# Patient Record
Sex: Female | Born: 1965 | Race: White | Hispanic: No | Marital: Married | Smoking: Current every day smoker
Health system: Southern US, Community
[De-identification: ages and names within clinical notes are randomized; demographics above are authoritative.]

## PROBLEM LIST (undated history)

## (undated) DIAGNOSIS — Z972 Presence of dental prosthetic device (complete) (partial): Secondary | ICD-10-CM

## (undated) DIAGNOSIS — L0293 Carbuncle, unspecified: Secondary | ICD-10-CM

## (undated) DIAGNOSIS — K219 Gastro-esophageal reflux disease without esophagitis: Secondary | ICD-10-CM

## (undated) DIAGNOSIS — E079 Disorder of thyroid, unspecified: Secondary | ICD-10-CM

## (undated) DIAGNOSIS — T7840XA Allergy, unspecified, initial encounter: Secondary | ICD-10-CM

## (undated) DIAGNOSIS — L0292 Furuncle, unspecified: Secondary | ICD-10-CM

## (undated) HISTORY — DX: Gastro-esophageal reflux disease without esophagitis: K21.9

## (undated) HISTORY — PX: TUBAL LIGATION: SHX77

## (undated) HISTORY — PX: DILATION AND CURETTAGE OF UTERUS: SHX78

## (undated) HISTORY — DX: Carbuncle, unspecified: L02.93

## (undated) HISTORY — DX: Disorder of thyroid, unspecified: E07.9

## (undated) HISTORY — DX: Furuncle, unspecified: L02.92

## (undated) HISTORY — DX: Allergy, unspecified, initial encounter: T78.40XA

## (undated) HISTORY — PX: OTHER SURGICAL HISTORY: SHX169

## (undated) HISTORY — DX: Presence of dental prosthetic device (complete) (partial): Z97.2

---

## 2000-11-23 ENCOUNTER — Other Ambulatory Visit: Admission: RE | Admit: 2000-11-23 | Discharge: 2000-11-23 | Payer: Self-pay | Admitting: Family Medicine

## 2002-07-08 ENCOUNTER — Encounter: Payer: Self-pay | Admitting: *Deleted

## 2002-07-08 ENCOUNTER — Ambulatory Visit (HOSPITAL_COMMUNITY): Admission: RE | Admit: 2002-07-08 | Discharge: 2002-07-08 | Payer: Self-pay | Admitting: *Deleted

## 2002-08-22 ENCOUNTER — Ambulatory Visit (HOSPITAL_COMMUNITY): Admission: RE | Admit: 2002-08-22 | Discharge: 2002-08-22 | Payer: Self-pay | Admitting: *Deleted

## 2002-08-22 ENCOUNTER — Encounter: Payer: Self-pay | Admitting: *Deleted

## 2002-11-05 ENCOUNTER — Ambulatory Visit (HOSPITAL_COMMUNITY): Admission: RE | Admit: 2002-11-05 | Discharge: 2002-11-05 | Payer: Self-pay | Admitting: *Deleted

## 2002-11-14 ENCOUNTER — Ambulatory Visit (HOSPITAL_COMMUNITY): Admission: AD | Admit: 2002-11-14 | Discharge: 2002-11-14 | Payer: Self-pay | Admitting: *Deleted

## 2002-11-16 ENCOUNTER — Inpatient Hospital Stay (HOSPITAL_COMMUNITY): Admission: AD | Admit: 2002-11-16 | Discharge: 2002-11-19 | Payer: Self-pay | Admitting: *Deleted

## 2002-12-13 ENCOUNTER — Ambulatory Visit (HOSPITAL_COMMUNITY): Admission: RE | Admit: 2002-12-13 | Discharge: 2002-12-13 | Payer: Self-pay | Admitting: *Deleted

## 2003-01-17 ENCOUNTER — Ambulatory Visit (HOSPITAL_COMMUNITY): Admission: RE | Admit: 2003-01-17 | Discharge: 2003-01-17 | Payer: Self-pay | Admitting: *Deleted

## 2003-01-17 ENCOUNTER — Encounter: Payer: Self-pay | Admitting: *Deleted

## 2003-01-20 ENCOUNTER — Ambulatory Visit (HOSPITAL_COMMUNITY): Admission: RE | Admit: 2003-01-20 | Discharge: 2003-01-20 | Payer: Self-pay | Admitting: *Deleted

## 2005-09-10 ENCOUNTER — Encounter (HOSPITAL_COMMUNITY): Admission: RE | Admit: 2005-09-10 | Discharge: 2005-10-10 | Payer: Self-pay | Admitting: Internal Medicine

## 2006-11-09 ENCOUNTER — Ambulatory Visit (HOSPITAL_COMMUNITY): Admission: RE | Admit: 2006-11-09 | Discharge: 2006-11-09 | Payer: Self-pay | Admitting: Internal Medicine

## 2006-11-25 ENCOUNTER — Ambulatory Visit (HOSPITAL_COMMUNITY): Admission: RE | Admit: 2006-11-25 | Discharge: 2006-11-25 | Payer: Self-pay | Admitting: Internal Medicine

## 2008-01-17 ENCOUNTER — Encounter (INDEPENDENT_AMBULATORY_CARE_PROVIDER_SITE_OTHER): Payer: Self-pay | Admitting: General Surgery

## 2008-01-17 ENCOUNTER — Ambulatory Visit (HOSPITAL_COMMUNITY): Admission: RE | Admit: 2008-01-17 | Discharge: 2008-01-17 | Payer: Self-pay | Admitting: General Surgery

## 2010-11-19 NOTE — Op Note (Signed)
Kelly Henson, Kelly Henson            ACCOUNT NO.:  [id number]   MEDICAL RECORD NO.:  [id number]          PATIENT TYPE:  AMB   LOCATION:  DAY                           FACILITY:  [facility]   PHYSICIAN:  Dalia Heading, M.D.  DATE OF BIRTH:  [birth year]   DATE OF PROCEDURE:  01/17/2008  DATE OF DISCHARGE:                               OPERATIVE REPORT   PREOPERATIVE DIAGNOSIS:  Cholecystitis, cholelithiasis.   POSTOPERATIVE DIAGNOSIS:  Cholecystitis, cholelithiasis.   PROCEDURE:  Laparoscopic cholecystectomy.   SURGEON:  Dalia Heading, MD   ANESTHESIA:  General endotracheal.   INDICATIONS:  The patient is a 45 year old white female who presents  with cholecystitis secondary to cholelithiasis.  The risks and benefits  of the procedure including bleeding, infection, hepatobiliary injury,  the possibility of an open procedure were fully explained to the  patient, gave informed consent.   PROCEDURE NOTE:  The patient was placed in the supine position.  After  induction of general endotracheal anesthesia, the abdomen was prepped  and draped using the usual sterile technique with Betadine.  Surgical  site confirmation was performed.   A supraumbilical incision was made down to the fascia.  A Veress needle  was introduced into the abdominal cavity and confirmation of placement  was done using the saline drop test.  The abdomen was then insufflated  to 16 mmHg pressure.  An 11-mm trocar was introduced into the abdominal  cavity under direct visualization without difficulty.  The patient was  placed in reversed Trendelenburg position.  Additional 11-mm trocars  were placed in the epigastric region and 5-mm trocars were placed in the  right upper quadrant and right flank regions.  The liver was inspected  and noted to be within normal limits.  The gallbladder was retracted  superior and laterally.  The dissection was begun around the  infundibulum of the gallbladder.  The cystic duct was  first identified.  Its juncture to the infundibulum fully identified.  Endoclips placed  proximally and distally on the cystic duct and the cystic duct was  divided.  This was likewise done in the cystic artery.  The gallbladder  was then freed away from the gallbladder fossa using Bovie  electrocautery.  The gallbladder was delivered through the epigastric  trocar site using EndoCatch bag.  The gallbladder fossa was inspected.  No abnormal bleeding or bile leakage was noted.  Surgicel was placed in  the gallbladder fossa.  All fluid and air were then evacuated from the  abdominal cavity prior to removal of the trocars.   All wounds were irrigated with normal saline.  All wounds were injected  with 0.5% Sensorcaine.  The supraumbilical fascia was reapproximated  using an 0 Vicryl interrupted suture.  All skin incisions were closed  using staples.  Betadine ointment and dry sterile dressings were  applied.   All tape and needle counts were correct at the end of the procedure.  The patient was extubated in the operating room and went back to  recovery room awake in stable condition.   COMPLICATIONS:  None.   SPECIMEN:  Gallbladder.  ESTIMATED BLOOD LOSS:  Minimal.      Dalia Heading, M.D.  Electronically Signed     MAJ/MEDQ  D:  01/17/2008  T:  01/17/2008  Job:  [id number]   cc:   Tesfaye D. Felecia Shelling, MD  Fax: [phone]

## 2010-11-19 NOTE — H&P (Signed)
NAME:  Kelly Henson, Kelly Henson            ACCOUNT NO.:  [id number]   MEDICAL RECORD NO.:  [id number]          PATIENT TYPE:  AMB   LOCATION:  DAY                           FACILITY:  [facility]   PHYSICIAN:  Dalia Heading, M.D.  DATE OF BIRTH:  10-10-1965   DATE OF ADMISSION:  DATE OF DISCHARGE:  LH                              HISTORY & PHYSICAL   CHIEF COMPLAINT:  Biliary colic secondary to cholelithiasis.   HISTORY OF PRESENT ILLNESS:  The patient is a 45 year old white female  who presents with a multiple-month history of recurrent epigastric and  right upper quadrant abdominal pain, nausea, and indigestion.  The  episodes have recently increased in frequency and she presented to the  emergency room for further evaluation treatment.  She was found on  ultrasound of the gallbladder to have cholelithiasis.  Her common bile  duct was within normal limits.  She states the pain radiates to her  right flank and to her right shoulder.  It is made worse with fatty  foods.  No fever, chills, or jaundice have been noted.   PAST MEDICAL HISTORY:  Includes hypothyroidism.   PAST SURGICAL HISTORY:  D&C.   CURRENT MEDICATIONS:  Synthroid.   ALLERGIES:  No known drug allergies.   REVIEW OF SYSTEMS:  Noncontributory.   PHYSICAL EXAMINATION:  The patient is a well-developed, well-nourished  white female in no acute distress.  She is afebrile.  VITAL SIGNS:  Stable.  LUNGS:  Clear to auscultation with equal breath sounds bilaterally.  HEART:  Regular rate and rhythm without S3, S4, or murmurs.  ABDOMEN:  Soft with tenderness down the right upper quadrant to  palpation.  No hepatosplenomegaly, masses, or hernias identified.   CBC, MET-7, and liver profile were all within normal limits.  Amylase  and lipase were within normal limits.   IMPRESSION:  Biliary colic secondary to cholelithiasis.   PLAN:  The patient is scheduled for a laparoscopic cholecystectomy on  January 17, 2008.  The risks and  benefits of the procedure including  bleeding, infection, hepatobiliary, and the possibility of an open  procedure were fully explained to the patient, gave informed consent.      Dalia Heading, M.D.  Electronically Signed     MAJ/MEDQ  D:  01/14/2008  T:  01/15/2008  Job:  [id number]   cc:   Tesfaye D. Felecia Shelling, MD  Fax: [phone]

## 2010-11-22 NOTE — H&P (Signed)
NAME:  Kelly Henson, Kelly Henson                        ACCOUNT NO.:  [id number]   MEDICAL RECORD NO.:  [id number]                  PATIENT TYPE:   LOCATION:                                       FACILITY:   PHYSICIAN:  Langley Gauss, M.D.                DATE OF BIRTH:   DATE OF ADMISSION:  12/13/2002  DATE OF DISCHARGE:                                HISTORY & PHYSICAL   This is a 45 year old, gravida 4, para 4, four prior vaginal deliveries who  was admitted for laparoscopic tubal ligation, lysing, bipolar cautery as  well as dilatation and curettage utilizing curettage only.  The patient  desires permanent sterilization at this time.  She understands that there  are other reversible forms of birth control available; however, she desires  to proceed with permanent and irreversible sterilization.  In addition, will  be performing a D&C as patient is noted to have a history of a retained  placenta.  Following vaginal delivery on 11/16/2002, the patient also  experienced postpartum hemorrhage due to this retained placenta with a  resultant anemia.  The patient has done well since delivery with only a mild  amount of irregular bleeding.  She has passed several small clots during  this past week; however, there is significant concern that there may  possibly be some adherent placenta remaining, and the patient would benefit  from curettage.  The patient notably has been on iron replacement therapy  with improvement in hemoglobin to 12.4.  She did not receive any blood  products during that most recent hospitalization.   SOCIAL HISTORY:  She smokes one to one and one-half packs per day.  She is  employed as a Lawyer for shipments.   PAST MEDICAL HISTORY:  1. Vaginal delivery x 4.  2. Pilonidal cyst removed about 10 years previously.   ALLERGIES:  No known drug allergies.   CURRENT MEDICATIONS:  Zyrtec 10 mg p.o. daily p.r.n.   PHYSICAL EXAMINATION:  GENERAL:  No acute distress.  Pleasant  female.  VITAL SIGNS:  Height 5 feet 6 inches, weight 192.  Blood pressure 129/80,  pulse 91, respiratory rate 20.  HEENT:  Negative.  NECK:  No adenopathy.  Neck is supple.  Thyroid not palpable.  LUNGS:  Clear.  CARDIOVASCULAR:  Regular rate and rhythm.  ABDOMEN:  Soft and nontender.  No surgical scars are identified.  No pelvic  or abdominal masses appreciated.  EXTREMITIES:  Normal.  PELVIC:  Normal external genitalia.  No lesions or ulcerations identified.  Very light vaginal bleeding on today's visit.  Cervix is noted to be closed.  Uterus is noted to be diffusely enlarged consistent with about 10 to 12-week  size.  It is, however, nontender.  No palpable adnexal masses.   LABORATORY DATA:  As stated previously, hemoglobin today 12.4.   ASSESSMENT:  The patient desires permanent sterilization at this time.  Risks of the operative  procedure discussed with the patient to include risk  of bowel or bladder or vascular injury as well as risk associated with the  general anesthesia.  The patient, in addition, does have possibility of some  retained placenta fragments which could ultimately lead to irregular menses;  thus, would also proceed with dilatation and curettage of uterine contents  during that same operative procedure on 12/13/2002.                                               Langley Gauss, M.D.    DC/MEDQ  D:  12/07/2002  T:  12/07/2002  Job:  [id number]

## 2010-11-22 NOTE — H&P (Signed)
   NAME:  Kelly Henson, Kelly Henson                        ACCOUNT NO.:  [id number]   MEDICAL RECORD NO.:  [id number]                  PATIENT TYPE:   LOCATION:                                       FACILITY:   PHYSICIAN:  Langley Gauss, M.D.                DATE OF BIRTH:   DATE OF ADMISSION:  12/13/2002  DATE OF DISCHARGE:                                HISTORY & PHYSICAL   ADDENDUM:  [name] is to be admitted through outpatient surgery for [facility]  and laparoscopic tubal ligation by using bipolar cautery.  The patient is  postpartum a vaginal delivery on Nov 17, 2002.  The delivery was complicated  by retained placenta requiring a curettage in labor and delivery.  Thus,  there is a concern for retained placental tissue which may be responsible  for her quantitative beta hCG of 140 obtained on December 09, 2002.  My  expectation is that this does represent some retained viable placental  tissue.  Thus it is recommended to repeat the quantitative beta hCG  preoperatively - either December 12, 2002 or December 13, 2002 - and if this does in  fact show a declining value I feel we can safely proceed with the planned  surgical procedure with the impression of retained placental tissue which  can be managed with the dilatation and curettage.                                                Langley Gauss, M.D.    DC/MEDQ  D:  12/12/2002  T:  12/12/2002  Job:  [id number]   cc:   [facility]

## 2010-11-22 NOTE — Op Note (Signed)
NAME:  Kelly Henson, Kelly Henson                      ACCOUNT NO.:  [id number]   MEDICAL RECORD NO.:  [id number]                   PATIENT TYPE:  AMB   LOCATION:  DAY                                  FACILITY:  [facility]   PHYSICIAN:  Langley Gauss, M.D.                DATE OF BIRTH:  1965-08-30   DATE OF PROCEDURE:  12/13/2002  DATE OF DISCHARGE:                                 OPERATIVE REPORT   PREOPERATIVE DIAGNOSES:  1. Desires permanent sterilization.  2. Postpartum bleeding secondary to retained placental tissue.   POSTOPERATIVE DIAGNOSES:  1. Desires permanent sterilization.  2. Postpartum bleeding secondary to retained placental tissue.   PROCEDURE:  1. Dilatation and curettage.  2. Laparoscopic tubal ligation utilizing bipolar cautery.   SURGEON:  Roylene Reason. Lisette Grinder, M.D.   ESTIMATED BLOOD LOSS:  Less than 100 cc   COMPLICATIONS:  None.   ANALGESIA:  General endotracheal.   SPECIMENS:  Uterine curettings for permanent section only.   FINDINGS AT TIME OF SURGERY:  Include mildly enlarged uterus sounding for a  depth of about 10 cm.  A moderate amount of placental tissue at the uterine  fundus.  The uterus was noted to be well supported. Following procedure a  normal regular intrauterine contour is achieved.   SUMMARY:  The patient was taken to the operating room and vital signs were  stable.  The patient underwent uncomplicated induction of general  endotracheal anesthesia.  She was then placed in the lithotomy position and  prepped and draped in the usual sterile manner.  A red rubber catheter was  used to drain about 50 cc clear yellow urine from the bladder.  The speculum  was then placed in the vaginal vault.  The anterior lip of the cervix was  grasped with a single-tooth tenaculum.  The uterus was then noted to be  sounded to a depth of 10 cm.  Progressive dilatation was then performed  utilizing Hager dilators up to a size #17 dilator.  This allowed passage of  a  curette up to the cervix into the uterine cavity and aggressive curettage  is performed of the uterine cavity until a fine uterine cry was appreciated  in all quadrants of the uterus.   The apparent retained placental tissue was identified within the anterior  portion of the uterine fundus.  Anterior curettage was continued but no  additional tissue was obtained.  In addition the Randall stone forceps were  used to explore the uterine cavity as well as sponge stick with additional  fragments of placental type tissue removed from the uterine wall.  After  completion of the curettage it was determined that the retained placental  tissue had been removed.   The curettage was then discontinued.  A Hulka tenaculum was used to grasp  the cervix at 12 o'clock.  I then changed to sterile gloves and standing at  the patient's left side, a  1 cm vertical incision was made just inferior to  the umbilicus; as well as suprapubic horizontal incision.  The abdominal  wall was then elevated.  The Veress needle was then placed through the  subumbilical incision and going perpendicular to the fascial plane.  A drop  test confirms the proper intraperitoneal placement with no apparent bowel or  vascular injury.  Pneumoperitoneum is then created by insufflating 4 liters  of CO2 gas at a filling pressure of less than 15 mmHg.  After assurance of  adequate pneumoperitoneum the Veress needle is removed and the abdominal  wall was again elevated.  The Vis-A-Port 10 mm trocar was freed, inserted  subumbilically angling towards the hollow of the sacrum.  This was done  atraumatically and confirms a proper atraumatic intraperitoneal placement.  Under direct visualization the 5 mm trocar is inserted suprapubically in the  midline.  This was likewise done atraumatically with the patient now in  Trendelenburg position and the uterus elevated I was able to visualize the  pelvic cavity.  The tubes and ovaries were noted  to be normal in appearance  with no adhesive disease.  The uterus was noted, likewise with normal size,  shape and consistency, with a normal exterior contour. No irregularities  identified.  No adhesive disease, no free fluid or purulence identified.  Normal erythema noted.   The triple cauterization is then performed bilaterally utilizing the  Kleppinger bipolar cauterization forceps with the most proximal being 1 cm  from the tubouterine junction.  Each of these bursts were then in direct  continuity with the previous with the most proximal, as stated previously,  being 1 cm from tubouterine junction.  Each of these were in direct  continuity of each other and at each time continuing until the cauterizing  current plateaued down at 1-2 LED lights. This resulted in extensive tubular  destruction bilaterally.  Both right and left fallopian tubes were  cauterized.   The procedure was then terminated, our instruments are removed as well as  the gas allowed to escape and the 3 incision sites were closed utilizing 2-0  Vicryl in deep interrupted fashion through-and-through the fascia.  This  likewise results in reapproximation of the skin edges.  No additional  suturing is required.  Band-aids are placed over the operative site.  The  procedure is terminated.  The Hulka tenaculum is removed from the uterine  cavity.  No active bleeding is noted to occur.  The patient is then reversed  of anesthesia and taken to the recovery room in stable condition.  I was  unable to locate any waiting family members.                                               Langley Gauss, M.D.    DC/MEDQ  D:  12/13/2002  T:  12/13/2002  Job:  [id number]

## 2010-11-22 NOTE — Discharge Summary (Signed)
   NAME:  Kelly Henson, Kelly Henson                        ACCOUNT NO.:  [id number]   MEDICAL RECORD NO.:  [id number]                   PATIENT TYPE:  OIB   LOCATION:  LDR3                                 FACILITY:  [facility]   PHYSICIAN:  Langley Gauss, M.D.                DATE OF BIRTH:  May 31, 1966   DATE OF ADMISSION:  11/05/2002  DATE OF DISCHARGE:  11/05/2002                                 DISCHARGE SUMMARY   OBSTETRIC OBSERVATION NOTE:  A 45 year old gravida 4, para 3, 36-2/[redacted] weeks  gestation who present to labor and deliver complaining of an episode of  vaginal bleeding this a.m.  Patient states when she got up this a.m. and  went to void she noticed a small area of blood in her undergarments.  She  denies any active bleeding at that time nor has she had any subsequent  bleeding.  The patient describes good fetal movement today, an occasional  tightening, increased pelvic pressure.  Patient's prenatal course otherwise  complicated only by her failure to keep her arrangements for a 3-hour  glucose tolerance test.  She is noted to have a 1-hour GTT on multiple  previous occasions.  She has been advised and a 3-hour GTT has been ordered  and the patient has agreed to have it performed.   PAST MEDICAL HISTORY:  Pertinent for three prior vaginal deliveries without  complications.   PHYSICAL EXAMINATION:  GENERAL:  Patient is in no acute distress.  ABDOMEN:  Gravid.  Uterus soft and nontender consistent with [redacted] weeks  gestation.  PELVIC:  Normal  external genitalia.  Cervix noted to be without lesion.  No  bleeding is identified.  No leakage of fluid.  Cervix on digital examination  1 cm dilated, very posterior with a -3 station, vertex presentation.  External fetal monitor - no uterine contractions are seen.  No uterine  irritability.  Fetal heart rate is 150 with normal  long term variability.   ASSESSMENT:  Multiparous patient at 36-weeks gestation, isolated limited  episode x1 of vaginal  bleeding and abdominal pain, false labor.   DISPOSITION:  Patient is to keep next scheduled office appointment.  Signs  or symptoms of labor, ruptured membranes reviewed with the patient prior to  discharge.                                                Langley Gauss, M.D.    DC/MEDQ  D:  11/05/2002  T:  11/05/2002  Job:  [id number]

## 2010-11-22 NOTE — Op Note (Signed)
   NAME:  KIAJA, SHORTY                        ACCOUNT NO.:  [id number]   MEDICAL RECORD NO.:  [id number]                   PATIENT TYPE:  INP   LOCATION:  A426                                 FACILITY:  [facility]   PHYSICIAN:  Langley Gauss, M.D.                DATE OF BIRTH:  07/16/1965   DATE OF PROCEDURE:  11/16/2002  DATE OF DISCHARGE:                                 OPERATIVE REPORT   PROGRESS NOTE:  The patient had a Foley bulb catheter placed intracervically  and inflated to a volume of 60 mL of sterile normal saline.  About two hours  after placement, it fell out spontaneously as it had been taped to the  patient's leg.  Thereafter she was managed expectantly.  She continued to  have reassuring fetal heart rate.  Pitocin induction was initiated at 0400.  When I examined the patient at 0800, the patient was noted to be 4 cm  dilated and at -1 station.  I attempted to perform an amniotomy.  However,  this was unsuccessful.  The patient was on 8 milliunits of Pitocin per  minute at that time.  She requested epidural analgesic.  Epidural was placed  without difficulty and gave an excellent bilateral block which allowed me to  examine the patient at which time she was found to be 4 cm dilated, 60%  effaced, -1 station, vertex presentation.  Amniotomy was performed on this  attempt with findings of clear amniotic fluid.  A fetal scalp electrode was  placed.  This documented a reassuring fetal heart rate.  Subsequently, the  heart rate seemed to be somewhat irregular.  Fetal scalp electrode was  replaced, but even with replacement, the heart rate was somewhat irregular  and may represent some PVCs in the baby.  Pitocin is continuing at this  point in time.                                               Langley Gauss, M.D.    DC/MEDQ  D:  11/17/2002  T:  11/18/2002  Job:  [id number]

## 2010-11-22 NOTE — Op Note (Signed)
NAMERAVENNE, WAYMENT                          ACCOUNT NO.:  [id number]   MEDICAL RECORD NO.:  [id number]                  PATIENT TYPE:  PINP   LOCATION:                                       FACILITY:  [facility]   PHYSICIAN:  Langley Gauss, M.D.                DATE OF BIRTH:  Nov 29, 1965   DATE OF PROCEDURE:  11/17/2002  DATE OF DISCHARGE:                                 OPERATIVE REPORT   DIAGNOSES:  1. A 37-6/7-week intrauterine pregnancy.  2. Gestational hypertension.   PROCEDURES:  1. Placement of continuous lumbar epidural analgesia by Langley Gauss, M.D.  2. Spontaneous assisted vaginal delivery of a 5 pound 13 ounce female infant     delivered over an intact perineum.  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   ESTIMATED BLOOD LOSS:  800 mL.   DELIVERING PHYSICIAN:  Delivery performed by Langley Gauss, M.D.   ANALGESIA:  Continuous lumbar epidural.  At the time of attempted manual  removal of the placenta, the patient was in addition treated with a bolus of  10 mL of 1% lidocaine and subsequently 5 mL of 2% lidocaine to facilitate  intrauterine manual exploration.   SUMMARY:  The patient had an epidural catheter placed at 4 cm dilatation.  After placement of the epidural I was able to perform amniotomy with the  findings of clear amniotic fluid, and a fetal scalp electrode was placed.  The patient had an excellent bilateral block in place.  Thereafter she  progressed rapidly to 8 cm dilatation, and I was notified when the patient  was complete.  She was thereafter placed in the dorsal lithotomy position,  prepped and draped in the usual sterile manner.  The patient pushed well  during a very short second stage of labor with a  well-controlled atraumatic  delivery in a direct OA position over the intact perineum.  Mouth and nares  were bulb-suctioned of clear amniotic fluid.  The umbilical cord is then  milked toward the infant, cord is doubly clamped and cut, and infant is  given to the patient for immediate bonding purposes.  Arterial cord gas and  cord blood are then obtained.  Gentle traction on the umbilical cord is then  performed, and it is noted to be a thin umbilical cord.  At this point in  time the umbilical cord was noted to release very close to its implantation  site on the placenta.  Thus I attempted to perform a manual extraction of  the placenta by reaching through the dilated cervix and grasping the  placenta, which was actually a uterine fundus.  However, rather than  grabbing  it and being able to remove it in a single piece, the placenta was  noted to be abnormally adherent and to be removed in a piecemeal-type  fashion.  I continued performing manual extraction of the placenta until I  could not reach all the way up to the uterine fundus.  At that point in time  I requested a large vaginal speculum.  The large speculum was placed;  however, this did not allow visualization of the cervix secondary to the  collapsing sidewall, so a Gelpi sidewall retractor was placed.  This then  did allow me to visualize the cervix.  The cervix itself was noted to be  without any lesions.  A curettage was performed of the uterine cavity, but  this is minimally helpful as a large amount of tissue was noted to be  present, particularly in the left portion of the uterine fundus.  Thus the  sponge stick is reintroduced and in a piecemeal fashion I was able to  continue to remove pieces of placental tissue from the left fundal portion  of the uterus.  Notably as this was performed as well as manual exploration,  the uterus was noted to begin to involute, which then allowed me to reach  the uterine fundus.  The  sponge stick was then used to remove the placenta  in a piecemeal-type fashion, and this was then followed by curettage with a  large banjo curette, followed by manual exploration utilizing my hands.  In  this manner I was able to remove what I thought was the entirety of the  placenta though it is in a very fragmented nature.  Continued exploration of  the placental walls reveals what appears to be no additional adherent  placenta.  The patient is noted to have very excellent uterine tone;  however, estimated blood loss is 800 mL.  The patient has become hypotensive  with systolics of 90s and diastolics of 50s.  However, the pulse remains 50-  60.  Thus, following removal a stat CBC was performed, which does give a  result of 11.7.  At this point in time the patient is noted not to be  actively bleeding.  She has excellent uterine tone.  The fundus is at the  umbilicus and very firm.  Thus, the patient was not transfused at this time.  CBC will be requested later today and the patient followed symptomatically  for any signs or symptoms of symptomatic anemia.                                               Langley Gauss, M.D.    DC/MEDQ  D:  11/17/2002  T:  11/18/2002  Job:  [id number]

## 2010-11-22 NOTE — Op Note (Signed)
   NAME:  ETOSHA, WETHERELL                        ACCOUNT NO.:  [id number]   MEDICAL RECORD NO.:  [id number]                   PATIENT TYPE:  INP   LOCATION:  LDR4                                 FACILITY:  [facility]   PHYSICIAN:  Langley Gauss, M.D.                DATE OF BIRTH:  1966-01-27   DATE OF PROCEDURE:  11/16/2002  DATE OF DISCHARGE:                                 OPERATIVE REPORT   PROCEDURE:  Placement of Foley bulb for mechanical ripening of the cervix  prior to amniotomy and induction.   SURGEON:  Langley Gauss, M.D.   COMPLICATIONS:  None.   SUMMARY:  The planned procedure was discussed with the patient.  External  fetal monitor revealed a reassuring fetal heart rate.  There was no  significant uterine activity traced.  The patient was placed in the dorsal  lithotomy position.  Sterile speculum examination revealed the cervix to be  without lesions, no active bleeding, no leakage of fluid.  A 16 French Foley  catheter was then passed through the 2-cm dilated cervical os and inflated  to a volume of 60 cc of sterile normal saline.  The syringe was then  removed.  The catheter was then placed under gentle traction to the  patient's left leg.  Nonstress test is now in progress.  There was no  significant change in the patient's uterine activity following the procedure  nor any rupture of membranes or vaginal bleeding.                                               Langley Gauss, M.D.    DC/MEDQ  D:  11/16/2002  T:  11/17/2002  Job:  [id number]

## 2010-11-22 NOTE — Op Note (Signed)
NAME:  Kelly Henson, Kelly Henson                      ACCOUNT NO.:  [id number]   MEDICAL RECORD NO.:  [id number]                   PATIENT TYPE:  AMB   LOCATION:  DAY                                  FACILITY:  [facility]   PHYSICIAN:  Langley Gauss, M.D.                DATE OF BIRTH:  24-Sep-1965   DATE OF PROCEDURE:  01/20/2003  DATE OF DISCHARGE:  01/20/2003                                 OPERATIVE REPORT   PREOPERATIVE DIAGNOSES:  1. Postpartum uterine bleeding.  2. History of retained placenta.   PROCEDURE:  Hysteroscopy and D&C.   SURGEON:  Langley Gauss, M.D.   ESTIMATED BLOOD LOSS:  Minimal.   ANESTHESIA:  General endotracheal.   SPECIMENS:  For permanent section only.   FINDINGS:  Normal-sized uterus.  No cervical lesions.  Minimal active  bleeding at time of procedure.  Within the uterine cavity was some very fine  frongy-appearing proliferative tissue.  At the fundal apex, however, is a  very smooth contour polypoid-type solid mass which was excised and likewise  goes with the specimen.   SUMMARY:  The patient was taken to the operating room.  Vital signs were  stable.  The patient underwent uncomplicated induction of general  endotracheal anesthesia and placed in the low lithotomy position and prepped  and draped in the usual sterile manner.   Sterile speculum examination was performed.  Minimal bleeding was noted to  be occurring.  The cervix was without lesions.  The anterior lip of the  cervix was grasped with a single-tooth tenaculum.  The uterus was noted to  sound to a depth of 8 cm.  Progressive dilatation was then performed up to a  size #23 dilator which allowed passage of the hysteroscope and sleeve for  diagnostic purposes.  The entire uterine cavity was surveyed, as described  previously.  The scissors was then placed through the operative port which  allowed me to aggressively excise tissue and lesions from the entirety of  the uterine cavity while  utilizing scissors to transect them at their base.  Specific attention was paid to the fundal mass to completely free it up from  its attachments to the uterine wall.  The hysteroscope was then removed, and  aggressive curettage was performed of the uterine cavity with the findings  of a large amount of what grossly appeared to be trophoblastic tissue.  The  Randall stone forceps was then introduced into the uterine cavity, and  again, aggressive search of the uterine cavity resulted in additional tissue  obtained from the uterine fundus which was included for permanent specimen.  Curettage was then continued until a fine gritty sensation was appreciated  in all quadrants of the uterus to include the uterine fundus, with no  further tissues obtained.  The hysteroscope was then inserted which  confirmed removal of nearly all of the tissue described previously and now a  smooth uterine contour internally.  The procedure was then terminated.  The  patient was reversed of anesthesia and taken to the recovery room in stable  condition.  She did receive 1 g of IV Ancef preoperatively.   DISPOSITION:  The patient is to follow up in the office in one week's time.  She was given a copy of the standardized discharge instructions.   DISCHARGE MEDICATIONS:  The patient has previously been prescribed  Vicoprofen for pain relief.   PERTINENT LABORATORY STUDIES:  HCG is negative.   HOSPITAL COURSE:  See previous dictations.  D&C and hysteroscopy performed  without complications on 01/20/2003.  The patient was discharged to home on  the same date of service with instructions to follow up in the office in one  week's time at which time pathology results will be available and certainly  will be instrumental in providing postoperative care.                                               Langley Gauss, M.D.    DC/MEDQ  D:  01/23/2003  T:  01/23/2003  Job:  [id number]

## 2010-11-22 NOTE — Discharge Summary (Signed)
   NAME:  Kelly Henson, Kelly Henson NO.:  [id number]   MEDICAL RECORD NO.:  [id number]                  PATIENT TYPE:   LOCATION:                                       FACILITY:   PHYSICIAN:  Langley Gauss, M.D.                DATE OF BIRTH:   DATE OF ADMISSION:  DATE OF DISCHARGE:                                 DISCHARGE SUMMARY   The patient is to be discharged to home on the day of service.  She was  given a copy of the standarized discharge instructions.   DISCHARGE MEDICATIONS:  Vicoprofen #30 with no refill.   PERTINENT LABORATORY STUDIES:  Include hemoglobin 12.4, hematocrit 36.0 with  a white count of 9.2.  Quantitative beta hCG obtained on December 09, 2002 is  140, quantitative beta hCG on December 13, 2002 is obtained at 58.  O positive  blood type.   ALLERGIES:  No known drug allergies.   HOSPITAL COURSE:  See previous dictations.  Postoperatively the patient did  well.  She had no postoperative complications.  Thus she is discharged to  home on same date of service December 13, 2002.                                               Langley Gauss, M.D.    DC/MEDQ  D:  12/13/2002  T:  12/13/2002  Job:  [id number]

## 2010-11-22 NOTE — Discharge Summary (Signed)
NAME:  Kelly Henson, Kelly Henson                      ACCOUNT NO.:  [id number]   MEDICAL RECORD NO.:  [id number]                   PATIENT TYPE:  INP   LOCATION:  A426                                 FACILITY:  [facility]   PHYSICIAN:  Langley Gauss, M.D.                DATE OF BIRTH:  1965/12/18   DATE OF ADMISSION:  11/16/2002  DATE OF DISCHARGE:  11/19/2002                                 DISCHARGE SUMMARY   DIAGNOSIS:  A 38-week intrauterine pregnancy with gestational hypertension.   PROCEDURES:  1. Foley bulb on 11/16/2002.  2. On 11/17/2002, epidural.  3. On 11/17/2002, delivery.  Delivery complicated by retained placenta due     to abnormal implantation site, requiring manual removal as well as     immediately postpartum, curettage with resultant anemia.   PERTINENT LABORATORY STUDIES:  Admission hemoglobin and hematocrit  14.3/40.2; immediately postpartum, while still in the delivery room,  hemoglobin 11.7.  Postpartum day number one, hemoglobin 11.6, hematocrit  32.9 with a white count of 16.4.  On 11/18/2002, hemoglobin 10.1, hematocrit  28.4 with a white count of 16.4.  Later the same day, hemoglobin 10.6,  hematocrit 29.7 with a white count that had increased to 18.7.  On day of  discharge, hemoglobin 9.9, hematocrit 28.4 with a white count stabilized and  diminished to 15.9.   MEDICATIONS:  1. Patient did receive Rocephin 1 gram IV q.24h., received two doses; at     time of discharge, she is afebrile.  2. She was given Keflex 500 mg p.o. q.i.d. times five.  3. Hemocyte-F one p.o. daily, number 30 with no refill.  4. Vicoprofen one every six hours p.r.n., number 30 with no refill.  5. HCTZ 25 mg p.o. daily p.r.n. fluid retention, number 30 with 1 refill.   DISCHARGE INSTRUCTIONS:  Patient is given a copy of standarized discharge  instructions.   FOLLOW UP:  She will follow up in the office in less than one week's time.  She is advised that should she begin passing any  tissue or having any  excessive vaginal bleeding, she should contact us immediately, at which time  we can proceed with evaluation and probable performance of a dilation and  curettage.  Patient is aware that there was difficulty with manual  extraction of the placenta, and the possibility exists that she does have  retained placental fragments; however, my impression is that at the time of  the completion of the procedure and considering patient's postop/partum  course that the placenta has been completely excised, and this should not be  a problem for [place].   HOSPITAL COURSE:  See previous dictations.  Patient did well postpartum.  Following delivery on 11/17/2002, she did have the curettage performed.  She, thereafter, remained afebrile, had good urine output.  Serial H&Hs were  followed.  She did remain afebrile.  At time of discharge, the uterus was  noted to  be firm, nontender, about two fingerbreadths below the umbilicus.  She does have 3+ pitting edema, for which she is treated with the HCTZ.  She  is bottle feeding and, as stated previously, will follow up in the office in  one week's time or less.                                               Langley Gauss, M.D.    DC/MEDQ  D:  11/19/2002  T:  11/19/2002  Job:  [id number]

## 2010-11-22 NOTE — H&P (Signed)
NAME:  Kelly Henson, CHICAS                        ACCOUNT NO.:  [id number]   MEDICAL RECORD NO.:  [id number]                   PATIENT TYPE:  INP   LOCATION:  A426                                 FACILITY:  [facility]   PHYSICIAN:  Langley Gauss, M.D.                DATE OF BIRTH:  18-Sep-1965   DATE OF ADMISSION:  11/16/2002  DATE OF DISCHARGE:                                HISTORY & PHYSICAL   HISTORY OF PRESENT ILLNESS:  This is a 45 year old, G4, P3, at 77 and 5/7ths  weeks gestation who is admitted for induction of labor secondary to findings  of gestational hypertension.  The patient's prenatal course is complicated  by close monitoring during this past week of elevated blood pressures.  She  is noted to have blood pressures of 140/85, 138/90, and 148/72, with no  prior history of any hypertension, and all previous blood pressures less  than or equal to 130, less than or equal to 80.  She has also had  development of significant 3+ pitting edema, but she is not hyperreflexic  and has only had trace proteinuria.  She is admitted for induction of labor  on 11/16/02.  She has had serial ultrasounds which have documented adequate  fetal growth, and a normal anatomic survey, O+ blood type.  The patient has  been noncompliant despite multiple attempts to get her to do a one hour  glucose tolerance test.  On several of each of these occasions she always  made up some excuse or just failed to show up to have the QTT performed.   PAST MEDICAL HISTORY:  She has three prior vaginal deliveries without  complications - 7 pounds, 4.5 ounces, 8 pounds, 5 ounces, 7 pounds, 5  ounces.  All of these deliveries were performed without complications, two  males and one female.  She did have an IUD removed in 7/03 with intent to  conceive.  She will be 37 at the time of delivery.  She was given genetic  counseling and offered amniocentesis.  Her AFP triple screen was noted to be  within normal limits.  She  did decline genetic amniocentesis.   SOCIAL HISTORY:  The patient is a Lawyer at [employer].  She did smoke one pack  per day at the beginning of the pregnancy, and was planning on quitting.  She does plan on bottle feeding.  She, on several occasions, has given mixed  signals regarding her desire for tubal ligation.   PHYSICAL EXAMINATION:  VITAL SIGNS:  Height 5'6.  Pre-pregnancy weight 175,  blood pressure 148/72, 140/85, and 138/90, pulse 80, respiratory rate 20.  HEENT:  Negative.  LUNGS:  Clear.  CARDIOVASCULAR:  Regular rate and rhythm.  ABDOMEN:  Soft and nontender.  No surgical scars were identified.  Vertex  presentation by Leopold's maneuvers.  Fundal height is 36 cm.  EXTREMITIES:  There were 2+ deep tendon reflexes.  No clonus.  There was 3+  pitting edema.  PELVIC:  Normal external genitalia.  No lesions or ulcerations identified.  Cervix noted to be 2 cm dilated.  Posterior position of the cervix, only 60%  effaced, with the vertex at a -1 station.  There is a reassuring fetal heart  rate by external fetal monitor.    ASSESSMENT:  G4, P3, at 37 and 5/7ths weeks gestation, to be induced for  findings of gestational hypertension with no preceding history of chronic  hypertension.  Due to the somewhat unfavorable nature of the cervix, a Foley  bulb catheter is placed for mechanical ripening of the cervix on 11/16/02.                                               Langley Gauss, M.D.    DC/MEDQ  D:  11/17/2002  T:  11/17/2002  Job:  [id number]

## 2010-11-22 NOTE — Op Note (Signed)
   NAME:  Kelly Henson, Kelly Henson                        ACCOUNT NO.:  [id number]   MEDICAL RECORD NO.:  [id number]                   PATIENT TYPE:  INP   LOCATION:  A426                                 FACILITY:  [facility]   PHYSICIAN:  Langley Gauss, M.D.                DATE OF BIRTH:  1965/07/23   DATE OF PROCEDURE:  11/17/2002  DATE OF DISCHARGE:                                 OPERATIVE REPORT   PROCEDURE:  Placement of continuous lumbar epidural analgesia at the L3-L4  interspace.   SURGEON:  Langley Gauss, M.D.   COMPLICATIONS:  None.   SUMMARY:  Appropriate informed consent was obtained.  Continuous electronic  fetal monitoring was performed.  The patient was placed in a seated position  at which time bony landmarks were identified.  The L3-L4 interspace was  chosen.  The patient's back was sterilely prepped and draped utilizing the  epidural kit.  Lidocaine 1% plain 5 cc was injected at the site to raise a  small skin wheal.  A 17-gauge Tuohy-Schliff needle was then utilized with  loss of resistance and ________ syringe to enter the epidural space on the  first attempt without difficulty.  Initial test dose of 5 cc of 1.5%  lidocaine plus epinephrine was injected through the epidural needle.  No  signs of CSF or intravascular injection obtained.  The epidural catheter was  then inserted to a depth of 5 cm.  The epidural needle was removed.  Aspiration test was negative.  Second test dose with 3 cc of 1.5% lidocaine  plus epinephrine injected through the epidural catheter.  Again, no signs of  CSF or intravascular injection obtained.  Thus, the patient was connected to  the infusion pump containing the standard mixture.  She will be treated by a  bolus of 10 cc followed by continuous infusion rate of 14 cc per hour.  At  the completion of the procedure, there was evidence of an excellent  bilateral block.                                               Langley Gauss, M.D.    DC/MEDQ  D:  11/17/2002  T:  11/18/2002  Job:  [id number]

## 2013-04-13 ENCOUNTER — Other Ambulatory Visit (HOSPITAL_COMMUNITY): Payer: Self-pay | Admitting: Internal Medicine

## 2013-04-13 DIAGNOSIS — Z139 Encounter for screening, unspecified: Secondary | ICD-10-CM

## 2013-04-21 ENCOUNTER — Ambulatory Visit (HOSPITAL_COMMUNITY)
Admission: RE | Admit: 2013-04-21 | Discharge: 2013-04-21 | Disposition: A | Discharge status: Home / Self Care | Payer: BC Managed Care – PPO | Source: Ambulatory Visit | Attending: Internal Medicine | Admitting: Internal Medicine

## 2013-04-21 DIAGNOSIS — Z1231 Encounter for screening mammogram for malignant neoplasm of breast: Secondary | ICD-10-CM | POA: Insufficient documentation

## 2013-04-21 DIAGNOSIS — Z139 Encounter for screening, unspecified: Secondary | ICD-10-CM

## 2013-04-26 ENCOUNTER — Other Ambulatory Visit: Payer: Self-pay | Admitting: Internal Medicine

## 2013-04-26 DIAGNOSIS — R928 Other abnormal and inconclusive findings on diagnostic imaging of breast: Secondary | ICD-10-CM

## 2013-05-10 ENCOUNTER — Other Ambulatory Visit (HOSPITAL_COMMUNITY)
Admission: RE | Admit: 2013-05-10 | Discharge: 2013-05-10 | Disposition: A | Discharge status: Home / Self Care | Payer: BC Managed Care – PPO | Source: Ambulatory Visit | Attending: Obstetrics & Gynecology | Admitting: Obstetrics & Gynecology

## 2013-05-10 ENCOUNTER — Ambulatory Visit (INDEPENDENT_AMBULATORY_CARE_PROVIDER_SITE_OTHER): Payer: BC Managed Care – PPO | Admitting: Women's Health

## 2013-05-10 ENCOUNTER — Encounter: Payer: Self-pay | Admitting: Women's Health

## 2013-05-10 VITALS — BP 140/80 | Ht 65.0 in | Wt 198.8 lb

## 2013-05-10 DIAGNOSIS — Z1151 Encounter for screening for human papillomavirus (HPV): Secondary | ICD-10-CM | POA: Insufficient documentation

## 2013-05-10 DIAGNOSIS — Z01419 Encounter for gynecological examination (general) (routine) without abnormal findings: Secondary | ICD-10-CM

## 2013-05-10 DIAGNOSIS — Z1212 Encounter for screening for malignant neoplasm of rectum: Secondary | ICD-10-CM

## 2013-05-10 DIAGNOSIS — E039 Hypothyroidism, unspecified: Secondary | ICD-10-CM | POA: Insufficient documentation

## 2013-05-10 LAB — HEMOCCULT GUIAC POC 1CARD (OFFICE)

## 2013-05-10 NOTE — Progress Notes (Signed)
Patient ID: Kelly Henson, female   DOB: 19-Nov-1965, 47 y.o.   MRN: [id number] Subjective:     Kelly Henson is a 47 y.o. [phone] Caucasian  female here for a routine well-woman exam.  Patient's last menstrual period was 04/27/2013.  Current complaints: reports occasional thick brown d/c just prior to menses beginning, and occ menses twice/mth, but has been normal lately. Very anxious about today's exam, states she hasn't had a pap smear in almost 20 years. Denies h/o abnormal pap smears. Denies h/o HTN.  Smoking Status: current smoker, 2ppd since 47yo, thinks at times she wants to quit Does not desire labs, states she just had labs w/ PCP Dr. Felecia Shelling about 1 month ago, she is scheduled to see him again in ~5 months  Personal health questionnaire reviewed: yes.  H/O hypothyroidism: on synthroid daily, managed by Dr. Felecia Shelling H/O GERD: on cimetidine daily H/O allergies: takes zyrtec daily H/O recurrent boils   Gynecologic History Patient's last menstrual period was 04/27/2013. Contraception: tubal ligation Last Pap: ~93yrs ago. Results were: normal per pt report Last mammogram: 04/22/13. Results were: abnormal: heterogeneously dense w/ possible mass Rt breast per mammo report. She goes back for f/u tomorrow.   Obstetric History OB History  No data available  [id number]  The following portions of the patient's history were reviewed and updated as appropriate: allergies, current medications, past family history, past medical history, past social history, past surgical history and problem list.  Review of Systems  Review of Systems  Constitutional: Negative for fever, chills, weight loss, malaise/fatigue and diaphoresis.  HENT: Negative for hearing loss, ear pain, nosebleeds, congestion, sore throat, neck       pain, tinnitus and ear discharge.   Eyes: Negative for blurred vision, double vision, photophobia, pain, discharge and       redness.  Respiratory: Negative for cough,  hemoptysis, sputum production, shortness of breath,       wheezing and stridor.   Cardiovascular: Negative for chest pain, palpitations, orthopnea, claudication, leg       swelling and PND.  Gastrointestinal: negative for abdominal pain. Negative for heartburn, nausea, vomiting,       diarrhea, constipation, blood in stool and melena.  Genitourinary: Negative for dysuria, urgency, frequency, hematuria, flank pain,       incontinence, and dyspareunia.  Musculoskeletal: Negative for myalgias, back pain, joint pain and falls.  Skin: Negative for itching and rash.  Neurological: Negative for dizziness, tingling, tremors, sensory change, speech change,     focal weakness, seizures, loss of consciousness, weakness and headaches.  Endo/Heme/Allergies: Negative for environmental allergies and polydipsia. Does not       bruise/bleed easily.  Psychiatric/Behavioral: Negative for depression, suicidal ideas, hallucinations, memory       loss and substance abuse. The patient is not nervous/anxious and does not have       insomnia.        Objective:     Physical Exam  BP 140/80  Ht 5\' 5"  (1.651 m)  Wt 198 lb 12.8 oz (90.175 kg)  BMI 33.08 kg/m2  LMP 04/27/2013 Constitutional: She is oriented to person, place, and time. She appears well-developed    and well-nourished.  HEENT:     Head: Normocephalic and atraumatic.           Right Ear: External ear normal.     Left Ear: External ear normal.     Nose: Nose normal.     Mouth/Throat: Oropharynx is clear and moist.  Eyes: Conjunctivae and EOM are normal. Pupils are equal, round, and reactive to light.    Right eye exhibits no discharge. Left eye exhibits no discharge. No scleral icterus.  Neck: Normal range of motion. Neck supple. No tracheal deviation present. No          thyromegaly present.  Cardiovascular: Normal rate, regular rhythm, normal heart sounds and intact distal          pulses.  Exam reveals no gallop and no friction rub.  No  murmur heard. Respiratory: Effort normal and breath sounds normal. No respiratory distress. She has       no wheezes. She has no rales. She exhibits no tenderness.  Breasts: dense w/ no dominate palpable masses, retraction or nipple discharge. She does have a palpable ridge Rt          breast ~ 10-12 o'clock. She has a f/u at [facility] tomorrow for abnormal mammogram on 10/17 w/ possible Rt breast mass. GI: Soft. Bowel sounds are normal. She exhibits no distension and no mass. There is no    tenderness. There is no rebound and no guarding.     Hemoccult: Neg Genitourinary:     Vulva is normal without lesions    Vagina is pink moist without discharge    Cervix: multiple nabothian cysts, and thin prep pap is done w/ HR HPV cotesting, cervix feels very        firm/stenotic    Uterus is normal size shape and contour    Adnexa is negative with normal sized ovaries   Musculoskeletal: Normal range of motion. She exhibits no edema and no tenderness.  Neurological: She is alert and oriented to person, place, and time. She has normal    reflexes. She displays normal reflexes. No cranial nerve deficit. She exhibits normal    muscle tone. Coordination normal.  Skin: Skin is warm and dry. No rash noted. No erythema. No pallor.  Psychiatric: She has a normal mood and affect. Her behavior is normal. Judgment and    thought content normal.       Assessment:     Healthy well-woman exam Abnormal mammogram 04/22/13 w/ possible Rt breast mass, has f/u tomorrow Smoker Hypothyroidism on synthroid GERD on cimetidine Allergies on zyrtec     Plan:   F/U 1 year for physical Keep f/u appt for tomorrow at [facility] d/t abnormal mammogram on 04/22/13 w/ possible Rt breast mass Colonoscopy at 47yo or sooner if problems Work towards smoking cessation, printed info and 1-800-quit-now # given F/U w/ PCP Dr. Felecia Shelling as scheduled   Marge Duncans 05/10/2013 12:24 PM

## 2013-05-10 NOTE — Patient Instructions (Addendum)
Kegel Exercises The goal of Kegel exercises is to isolate and exercise your pelvic floor muscles. These muscles act as a hammock that supports the rectum, vagina, small intestine, and uterus. As the muscles weaken, the hammock sags and these organs are displaced from their normal positions. Kegel exercises can strengthen your pelvic floor muscles and help you to improve bladder and bowel control, improve sexual response, and help reduce many problems and some discomfort during pregnancy. Kegel exercises can be done anywhere and at any time. HOW TO PERFORM KEGEL EXERCISES 1. Locate your pelvic floor muscles. To do this, squeeze (contract) the muscles that you use when you try to stop the flow of urine. You will feel a tightness in the vaginal area (women) and a tight lift in the rectal area (men and women). 2. When you begin, contract your pelvic muscles tight for 2 5 seconds, then relax them for 2 5 seconds. This is one set. Do 4 5 sets with a short pause in between. 3. Contract your pelvic muscles for 8 10 seconds, then relax them for 8 10 seconds. Do 4 5 sets. If you cannot contract your pelvic muscles for 8 10 seconds, try 5 7 seconds and work your way up to 8 10 seconds. Your goal is 4 5 sets of 10 contractions each day. Keep your stomach, buttocks, and legs relaxed during the exercises. Perform sets of both short and long contractions. Vary your positions. Perform these contractions 3 4 times per day. Perform sets while you are:   Lying in bed in the morning.  Standing at lunch.  Sitting in the late afternoon.  Lying in bed at night. You should do 40 50 contractions per day. Do not perform more Kegel exercises per day than recommended. Overexercising can cause muscle fatigue. Continue these exercises for for at least 15 20 weeks or as directed by your caregiver. Document Released: 06/09/2012 Document Reviewed: 03/18/2012 [facility] Patient Information 2014 [place], [place].  Smoking  Cessation 1-800-quit-now Quitting smoking is important to your health and has many advantages. However, it is not always easy to quit since nicotine is a very addictive drug. Often times, people try 3 times or more before being able to quit. This document explains the best ways for you to prepare to quit smoking. Quitting takes hard work and a lot of effort, but you can do it. ADVANTAGES OF QUITTING SMOKING  You will live longer, feel better, and live better.  Your body will feel the impact of quitting smoking almost immediately.  Within 20 minutes, blood pressure decreases. Your pulse returns to its normal level.  After 8 hours, carbon monoxide levels in the blood return to normal. Your oxygen level increases.  After 24 hours, the chance of having a heart attack starts to decrease. Your breath, hair, and body stop smelling like smoke.  After 48 hours, damaged nerve endings begin to recover. Your sense of taste and smell improve.  After 72 hours, the body is virtually free of nicotine. Your bronchial tubes relax and breathing becomes easier.  After 2 to 12 weeks, lungs can hold more air. Exercise becomes easier and circulation improves.  The risk of having a heart attack, stroke, cancer, or lung disease is greatly reduced.  After 1 year, the risk of coronary heart disease is cut in half.  After 5 years, the risk of stroke falls to the same as a nonsmoker.  After 10 years, the risk of lung cancer is cut in half and the  risk of other cancers decreases significantly.  After 15 years, the risk of coronary heart disease drops, usually to the level of a nonsmoker.  If you are pregnant, quitting smoking will improve your chances of having a healthy baby.  The people you live with, especially any children, will be healthier.  You will have extra money to spend on things other than cigarettes. QUESTIONS TO THINK ABOUT BEFORE ATTEMPTING TO QUIT You may want to talk about your answers with  your caregiver.  Why do you want to quit?  If you tried to quit in the past, what helped and what did not?  What will be the most difficult situations for you after you quit? How will you plan to handle them?  Who can help you through the tough times? Your family? Friends? A caregiver?  What pleasures do you get from smoking? What ways can you still get pleasure if you quit? Here are some questions to ask your caregiver:  How can you help me to be successful at quitting?  What medicine do you think would be best for me and how should I take it?  What should I do if I need more help?  What is smoking withdrawal like? How can I get information on withdrawal? GET READY  Set a quit date.  Change your environment by getting rid of all cigarettes, ashtrays, matches, and lighters in your home, car, or work. Do not let people smoke in your home.  Review your past attempts to quit. Think about what worked and what did not. GET SUPPORT AND ENCOURAGEMENT You have a better chance of being successful if you have help. You can get support in many ways.  Tell your family, friends, and co-workers that you are going to quit and need their support. Ask them not to smoke around you.  Get individual, group, or telephone counseling and support. Programs are available at [employer] and health centers. Call your local health department for information about programs in your area.  Spiritual beliefs and practices may help some smokers quit.  Download a "quit meter" on your computer to keep track of quit statistics, such as how long you have gone without smoking, cigarettes not smoked, and money saved.  Get a self-help book about quitting smoking and staying off of tobacco. LEARN NEW SKILLS AND BEHAVIORS  Distract yourself from urges to smoke. Talk to someone, go for a walk, or occupy your time with a task.  Change your normal routine. Take a different route to work. Drink tea instead of  coffee. Eat breakfast in a different place.  Reduce your stress. Take a hot bath, exercise, or read a book.  Plan something enjoyable to do every day. Reward yourself for not smoking.  Explore interactive web-based programs that specialize in helping you quit. GET MEDICINE AND USE IT CORRECTLY Medicines can help you stop smoking and decrease the urge to smoke. Combining medicine with the above behavioral methods and support can greatly increase your chances of successfully quitting smoking.  Nicotine replacement therapy helps deliver nicotine to your body without the negative effects and risks of smoking. Nicotine replacement therapy includes nicotine gum, lozenges, inhalers, nasal sprays, and skin patches. Some may be available over-the-counter and others require a prescription.  Antidepressant medicine helps people abstain from smoking, but how this works is unknown. This medicine is available by prescription.  Nicotinic receptor partial agonist medicine simulates the effect of nicotine in your brain. This medicine is available by prescription.  Ask your caregiver for advice about which medicines to use and how to use them based on your health history. Your caregiver will tell you what side effects to look out for if you choose to be on a medicine or therapy. Carefully read the information on the package. Do not use any other product containing nicotine while using a nicotine replacement product.  RELAPSE OR DIFFICULT SITUATIONS Most relapses occur within the first 3 months after quitting. Do not be discouraged if you start smoking again. Remember, most people try several times before finally quitting. You may have symptoms of withdrawal because your body is used to nicotine. You may crave cigarettes, be irritable, feel very hungry, cough often, get headaches, or have difficulty concentrating. The withdrawal symptoms are only temporary. They are strongest when you first quit, but they will go away  within 10 14 days. To reduce the chances of relapse, try to:  Avoid drinking alcohol. Drinking lowers your chances of successfully quitting.  Reduce the amount of caffeine you consume. Once you quit smoking, the amount of caffeine in your body increases and can give you symptoms, such as a rapid heartbeat, sweating, and anxiety.  Avoid smokers because they can make you want to smoke.  Do not let weight gain distract you. Many smokers will gain weight when they quit, usually less than 10 pounds. Eat a healthy diet and stay active. You can always lose the weight gained after you quit.  Find ways to improve your mood other than smoking. FOR MORE INFORMATION  www.smokefree.gov  Document Released: 06/17/2001 Document Revised: 12/23/2011 Document Reviewed: 10/02/2011 [facility] Patient Information 2014 [place], [place].

## 2013-05-11 ENCOUNTER — Ambulatory Visit (HOSPITAL_COMMUNITY)
Admission: RE | Admit: 2013-05-11 | Discharge: 2013-05-11 | Disposition: A | Discharge status: Home / Self Care | Payer: BC Managed Care – PPO | Source: Ambulatory Visit | Attending: Internal Medicine | Admitting: Internal Medicine

## 2013-05-11 ENCOUNTER — Other Ambulatory Visit (HOSPITAL_COMMUNITY): Payer: Self-pay | Admitting: Internal Medicine

## 2013-05-11 DIAGNOSIS — R928 Other abnormal and inconclusive findings on diagnostic imaging of breast: Secondary | ICD-10-CM | POA: Insufficient documentation

## 2013-05-11 DIAGNOSIS — N6009 Solitary cyst of unspecified breast: Secondary | ICD-10-CM | POA: Insufficient documentation

## 2013-05-12 ENCOUNTER — Other Ambulatory Visit: Payer: Self-pay

## 2015-04-30 ENCOUNTER — Ambulatory Visit (HOSPITAL_COMMUNITY)
Admission: RE | Admit: 2015-04-30 | Discharge: 2015-04-30 | Disposition: A | Discharge status: Home / Self Care | Payer: 59 | Source: Ambulatory Visit | Attending: Internal Medicine | Admitting: Internal Medicine

## 2015-04-30 ENCOUNTER — Other Ambulatory Visit (HOSPITAL_COMMUNITY): Payer: Self-pay | Admitting: Internal Medicine

## 2015-04-30 DIAGNOSIS — M545 Low back pain: Secondary | ICD-10-CM | POA: Insufficient documentation

## 2015-05-11 ENCOUNTER — Telehealth: Payer: Self-pay | Admitting: *Deleted

## 2015-05-11 NOTE — Telephone Encounter (Signed)
I called Patient and I made her aware that we have received her referral from Dr. Felecia ShellingFanta and Dr. Romeo AppleHarrison will have to review her referral because he does limited back pain, I also made patient aware that if Dr. Romeo AppleHarrison approves her referral we will need [facility] from Dr. Felecia ShellingFanta before scheduling her appt. Patient stated she understands and that will be fine to do.

## 2015-05-17 ENCOUNTER — Other Ambulatory Visit: Payer: Self-pay | Admitting: "Endocrinology

## 2015-05-23 NOTE — Telephone Encounter (Signed)
I called patient to schedule a appt we have [facility] from Dr. 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 Patient requested a appt at 12:45, I made her aware we are at lunch from 12:30-1:30.

## 2015-06-21 ENCOUNTER — Encounter: Payer: Self-pay | Admitting: *Deleted

## 2015-11-21 DIAGNOSIS — E039 Hypothyroidism, unspecified: Secondary | ICD-10-CM | POA: Diagnosis not present

## 2015-11-21 DIAGNOSIS — J209 Acute bronchitis, unspecified: Secondary | ICD-10-CM | POA: Diagnosis not present

## 2015-11-21 DIAGNOSIS — F17200 Nicotine dependence, unspecified, uncomplicated: Secondary | ICD-10-CM | POA: Diagnosis not present

## 2015-11-30 ENCOUNTER — Other Ambulatory Visit (HOSPITAL_COMMUNITY): Payer: Self-pay | Admitting: Internal Medicine

## 2015-11-30 ENCOUNTER — Ambulatory Visit (HOSPITAL_COMMUNITY)
Admission: RE | Admit: 2015-11-30 | Discharge: 2015-11-30 | Disposition: A | Discharge status: Home / Self Care | Payer: BLUE CROSS/BLUE SHIELD | Source: Ambulatory Visit | Attending: Internal Medicine | Admitting: Internal Medicine

## 2015-11-30 DIAGNOSIS — J40 Bronchitis, not specified as acute or chronic: Secondary | ICD-10-CM | POA: Insufficient documentation

## 2015-11-30 DIAGNOSIS — R059 Cough, unspecified: Secondary | ICD-10-CM

## 2015-11-30 DIAGNOSIS — R05 Cough: Secondary | ICD-10-CM

## 2015-11-30 DIAGNOSIS — R062 Wheezing: Secondary | ICD-10-CM | POA: Diagnosis not present

## 2016-01-01 ENCOUNTER — Encounter: Payer: Self-pay | Admitting: Family Medicine

## 2016-01-01 ENCOUNTER — Ambulatory Visit (INDEPENDENT_AMBULATORY_CARE_PROVIDER_SITE_OTHER): Payer: BLUE CROSS/BLUE SHIELD | Admitting: Family Medicine

## 2016-01-01 VITALS — BP 120/72 | Temp 98.5°F | Ht 64.5 in | Wt 193.8 lb

## 2016-01-01 DIAGNOSIS — Z79899 Other long term (current) drug therapy: Secondary | ICD-10-CM

## 2016-01-01 DIAGNOSIS — E039 Hypothyroidism, unspecified: Secondary | ICD-10-CM

## 2016-01-01 DIAGNOSIS — J329 Chronic sinusitis, unspecified: Secondary | ICD-10-CM | POA: Diagnosis not present

## 2016-01-01 DIAGNOSIS — J4520 Mild intermittent asthma, uncomplicated: Secondary | ICD-10-CM | POA: Diagnosis not present

## 2016-01-01 DIAGNOSIS — Z13220 Encounter for screening for lipoid disorders: Secondary | ICD-10-CM

## 2016-01-01 DIAGNOSIS — J310 Chronic rhinitis: Secondary | ICD-10-CM

## 2016-01-01 MED ORDER — VARENICLINE TARTRATE 1 MG PO TABS: 1.0000 mg | ORAL_TABLET | Freq: Two times a day (BID) | ORAL | Status: DC

## 2016-01-01 MED ORDER — AMOXICILLIN-POT CLAVULANATE 875-125 MG PO TABS: 1.0000 | ORAL_TABLET | Freq: Two times a day (BID) | ORAL | Status: DC

## 2016-01-01 MED ORDER — VARENICLINE TARTRATE 0.5 MG X 11 & 1 MG X 42 PO MISC: ORAL | Status: DC

## 2016-01-01 MED ORDER — HYDROCODONE-HOMATROPINE 5-1.5 MG/5ML PO SYRP: 5.0000 mL | ORAL_SOLUTION | Freq: Four times a day (QID) | ORAL | Status: DC | PRN

## 2016-01-01 NOTE — Progress Notes (Signed)
   Subjective:    Patient ID: Kelly Henson, female    DOB: 10/02/1965, 50 y.o.   MRN: [id number]  Cough This is a new problem. The current episode started 1 to 4 weeks ago. Associated symptoms include ear pain, a fever, headaches, nasal congestion and wheezing.   Left ear discomfort and chest cong. No high fevers. Cough productive at times.  Sense of wheeziness and the chest intermittently. Patient has inhaler. Next  Long-standing history of smoking. 30-pack-year history. Smokes for cigarette early morning would like to try something to quit.  Some prod some   Smoking a ppd   Review of Systems  Constitutional: Positive for fever.  HENT: Positive for ear pain.   Respiratory: Positive for cough and wheezing.   Neurological: Positive for headaches.       Objective:   Physical Exam  Alert vitals stable. HEENT moderate nasal congestion left TM retracted. Parents normal lungs rare bronchial sound rare wheeze during cough heart regular in rhythm      Assessment & Plan:  Impression 1 sinusitis bronchitis subacute in nature. #2 chronic smoker discussed at length patient would like to stop choices discussed #3 hypothyroidism. Plan Chantix discussed and initiated. Antibiotics prescribed. Appropriate blood work. Diet exercise discussed. Follow-up as scheduled [facility]

## 2016-01-26 DIAGNOSIS — Z13220 Encounter for screening for lipoid disorders: Secondary | ICD-10-CM | POA: Diagnosis not present

## 2016-01-26 DIAGNOSIS — Z79899 Other long term (current) drug therapy: Secondary | ICD-10-CM | POA: Diagnosis not present

## 2016-01-26 DIAGNOSIS — E039 Hypothyroidism, unspecified: Secondary | ICD-10-CM | POA: Diagnosis not present

## 2016-01-27 LAB — HEPATIC FUNCTION PANEL
ALBUMIN: 4.3 g/dL (ref 3.5–5.5)
ALT: 21 IU/L (ref 0–32)
AST: 21 IU/L (ref 0–40)
Alkaline Phosphatase: 77 IU/L (ref 39–117)
BILIRUBIN TOTAL: 0.3 mg/dL (ref 0.0–1.2)
BILIRUBIN, DIRECT: 0.11 mg/dL (ref 0.00–0.40)
TOTAL PROTEIN: 6.7 g/dL (ref 6.0–8.5)

## 2016-01-27 LAB — LIPID PANEL
CHOL/HDL RATIO: 3.6 ratio (ref 0.0–4.4)
Cholesterol, Total: 182 mg/dL (ref 100–199)
HDL: 51 mg/dL (ref >39)
LDL CALC: 109 mg/dL — AB (ref 0–99)
Triglycerides: 112 mg/dL (ref 0–149)
VLDL Cholesterol Cal: 22 mg/dL (ref 5–40)

## 2016-01-27 LAB — TSH: TSH: 0.568 u[IU]/mL (ref 0.450–4.500)

## 2016-01-27 LAB — BASIC METABOLIC PANEL
BUN/Creatinine Ratio: 14 (ref 9–23)
BUN: 11 mg/dL (ref 6–24)
CALCIUM: 9.8 mg/dL (ref 8.7–10.2)
CHLORIDE: 100 mmol/L (ref 96–106)
CO2: 22 mmol/L (ref 18–29)
CREATININE: 0.81 mg/dL (ref 0.57–1.00)
GFR, EST AFRICAN AMERICAN: 99 mL/min/{1.73_m2} (ref >59)
GFR, EST NON AFRICAN AMERICAN: 86 mL/min/{1.73_m2} (ref >59)
Glucose: 90 mg/dL (ref 65–99)
Potassium: 5 mmol/L (ref 3.5–5.2)
Sodium: 139 mmol/L (ref 134–144)

## 2016-01-27 LAB — T4, FREE: Free T4: 1.61 ng/dL (ref 0.82–1.77)

## 2016-02-01 ENCOUNTER — Ambulatory Visit (INDEPENDENT_AMBULATORY_CARE_PROVIDER_SITE_OTHER): Payer: BLUE CROSS/BLUE SHIELD | Admitting: Family Medicine

## 2016-02-01 VITALS — BP 118/82 | Ht 66.0 in | Wt 198.0 lb

## 2016-02-01 DIAGNOSIS — E785 Hyperlipidemia, unspecified: Secondary | ICD-10-CM | POA: Diagnosis not present

## 2016-02-01 DIAGNOSIS — J4520 Mild intermittent asthma, uncomplicated: Secondary | ICD-10-CM | POA: Diagnosis not present

## 2016-02-01 DIAGNOSIS — E039 Hypothyroidism, unspecified: Secondary | ICD-10-CM

## 2016-02-01 DIAGNOSIS — R0782 Intercostal pain: Secondary | ICD-10-CM

## 2016-02-01 MED ORDER — ONDANSETRON 4 MG PO TBDP: 4.0000 mg | ORAL_TABLET | Freq: Three times a day (TID) | ORAL | 0 refills | Status: DC | PRN

## 2016-02-01 NOTE — Progress Notes (Signed)
   Subjective:    Patient ID: Kelly Henson, female    DOB: Jun 05, 1966, 50 y.o.   MRN: [id number]  HPI  Patient arrives for a follow up on chantix-started the med 9 days ago and has had some nausea and chest pain on med but has noticed it is helping her some.  chantix nausea now sig  Breathing better   Chest discomfort sore improved with inhaler.  Patient compliant with thyroid medicine. Still experiencing fatigue though improved. Next  Expressing nausea with the the Chantix but would like to maintain Chantix. No bad dreams no particularly negative side effects.  Reports trying to watch diet. Next  Chest pain sharp sore left anterior chest  Rare cough   Results for orders placed or performed in visit on 01/01/16  Lipid panel  Result Value Ref Range   Cholesterol, Total 182 100 - 199 mg/dL   Triglycerides 409 0 - 149 mg/dL   HDL 51 >81 mg/dL   VLDL Cholesterol Cal 22 5 - 40 mg/dL   LDL Calculated 191 (H) 0 - 99 mg/dL   Chol/HDL Ratio 3.6 0.0 - 4.4 ratio units  Hepatic function panel  Result Value Ref Range   Total Protein 6.7 6.0 - 8.5 g/dL   Albumin 4.3 3.5 - 5.5 g/dL   Bilirubin Total 0.3 0.0 - 1.2 mg/dL   Bilirubin, Direct 4.78 0.00 - 0.40 mg/dL   Alkaline Phosphatase 77 39 - 117 IU/L   AST 21 0 - 40 IU/L   ALT 21 0 - 32 IU/L  Basic metabolic panel  Result Value Ref Range   Glucose 90 65 - 99 mg/dL   BUN 11 6 - 24 mg/dL   Creatinine, Ser 2.95 0.57 - 1.00 mg/dL   GFR calc non Af Amer 86 >59 mL/min/1.73   GFR calc Af Amer 99 >59 mL/min/1.73   BUN/Creatinine Ratio 14 9 - 23   Sodium 139 134 - 144 mmol/L   Potassium 5.0 3.5 - 5.2 mmol/L   Chloride 100 96 - 106 mmol/L   CO2 22 18 - 29 mmol/L   Calcium 9.8 8.7 - 10.2 mg/dL  T4, free  Result Value Ref Range   Free T4 1.61 0.82 - 1.77 ng/dL  TSH  Result Value Ref Range   TSH 0.568 0.450 - 4.500 uIU/mL    Review of Systems No headache, no major weight loss or weight gain, no chest pain no back pain abdominal  pain no change in bowel habits complete ROS otherwise negative     Objective:   Physical Exam  Alert vital stable. Lungs clear. Heart regular in rhythm. HEENT normal. Left anterior chest no residual tenderness. Abdomen benign. Thyroid nonpalpable.      Assessment & Plan:  Impression 1 history of hyperlipidemia. Overall numbers good reviewed with patient today #2 hypothyroidism. No symptoms of low other than occasional fatigue. TSH and T4 considered in good control discussed #3 smoking cessation with history of reactive airways some potential side effects with nausea from Chantix. Patient would like to maintain Chantix and had nausea medicine discussed plan add Zofran. Other medications refilled. Diet exercise discussed. Chest pain resolved sounds basically musculoskeletal no need for further workup

## 2016-07-08 ENCOUNTER — Telehealth: Payer: Self-pay | Admitting: Family Medicine

## 2016-07-08 MED ORDER — LEVOTHYROXINE SODIUM 125 MCG PO TABS: 125.0000 ug | ORAL_TABLET | Freq: Every day | ORAL | 1 refills | Status: DC

## 2016-07-08 NOTE — Telephone Encounter (Signed)
Patient was being prescribed Levothyroxine 125 mg, 1 times a day by Dr. Felecia ShellingFanta, but she has recently transferred care to our office.  She is requesting Dr. Brett CanalesSteve to send in a 3 month supply of this medication.  She was last seen by Dr. Brett CanalesSteve on 02/01/16.  [facility] [place]

## 2016-07-08 NOTE — Telephone Encounter (Signed)
bw good in July may give six mo worth

## 2016-07-08 NOTE — Telephone Encounter (Signed)
Patient notified refill sent to pharmacy.  

## 2016-08-25 ENCOUNTER — Encounter: Payer: Self-pay | Admitting: Family Medicine

## 2016-08-25 ENCOUNTER — Ambulatory Visit (INDEPENDENT_AMBULATORY_CARE_PROVIDER_SITE_OTHER): Payer: BLUE CROSS/BLUE SHIELD | Admitting: Family Medicine

## 2016-08-25 VITALS — BP 122/76 | Temp 98.7°F | Ht 66.0 in | Wt 201.0 lb

## 2016-08-25 DIAGNOSIS — R3129 Other microscopic hematuria: Secondary | ICD-10-CM

## 2016-08-25 DIAGNOSIS — K2900 Acute gastritis without bleeding: Secondary | ICD-10-CM | POA: Diagnosis not present

## 2016-08-25 DIAGNOSIS — R350 Frequency of micturition: Secondary | ICD-10-CM | POA: Diagnosis not present

## 2016-08-25 LAB — POCT URINALYSIS DIPSTICK
SPEC GRAV UA: 1.02
pH, UA: 5

## 2016-08-25 MED ORDER — OMEPRAZOLE 40 MG PO CPDR: 40.0000 mg | DELAYED_RELEASE_CAPSULE | Freq: Every day | ORAL | 2 refills | Status: DC

## 2016-08-25 NOTE — Progress Notes (Signed)
   Subjective:    Patient ID: Kelly Henson, female    DOB: 02/04/1966, 51 y.o.   MRN: [id number] Patient arrives office with numerous concerns Abdominal Pain  This is a recurrent problem. Episode onset: 2 -3 months ago. The pain is located in the epigastric region. The pain is aggravated by eating.   Mid epigsstric pain, hurts off and on, often worse aftr eating, feels bloated at timrd, dkipd mras t . Pos hx of reflux and heartburn  Frequent urination and back pain off and on for years.   Takes otc cimetidine every day  Pos caffeine  With coffe, plus smokes   Pt notes low back discomfort with incr urgency, sometimes a lot and someetimes at night, some dysuria at times. Progressive over the past year. Occasional dysuria. No obvious hematuria.      Results for orders placed or performed in visit on 08/25/16  POCT urinalysis dipstick  Result Value Ref Range   Color, UA     Clarity, UA     Glucose, UA     Bilirubin, UA     Ketones, UA     Spec Grav, UA 1.020    Blood, UA     pH, UA 5.0    Protein, UA     Urobilinogen, UA     Nitrite, UA     Leukocytes, UA  Negative     Review of Systems  Gastrointestinal: Positive for abdominal pain.   No vomiting no diarrhea no rash    Objective:   Physical Exam  Alert vital stable HET normal lungs clear heart rare rhythm epigastric tenderness noted no rebound no guarding no discrete masses excellent bowel sounds no CVA tenderness. Next  Urinalysis 4-6 red blood cells per high-power field      Assessment & Plan:  Impression 1 microscopic hematuria with intermittent symptomatology over the last year including dysuria increase frequency low back discomfort also some risk factors with age 51 smoking discussed at length urology referral warrant #2 epigastric pain gastritis like an presentation with numerous risk factors will add H. pylori and initiate proton pump inhibitor. Follow-up as noted. At one point patient is going need a  GI referral at least for colonoscopy we will see if this gastritis like presentation call stat

## 2016-08-26 ENCOUNTER — Telehealth: Payer: Self-pay | Admitting: Family Medicine

## 2016-08-26 NOTE — Telephone Encounter (Signed)
Wanting results of labs done yesterday.

## 2016-08-27 ENCOUNTER — Encounter: Payer: Self-pay | Admitting: Family Medicine

## 2016-08-27 LAB — H PYLORI, IGM, IGG, IGA AB
H pylori, IgM Abs: <9 units (ref 0.0–8.9)
H. pylori, IgG AbS: <0.8 Index Value (ref 0.00–0.79)

## 2016-08-27 NOTE — Telephone Encounter (Signed)
See lab report. Discussed with pt.

## 2016-10-02 ENCOUNTER — Ambulatory Visit (INDEPENDENT_AMBULATORY_CARE_PROVIDER_SITE_OTHER): Payer: BLUE CROSS/BLUE SHIELD | Admitting: Family Medicine

## 2016-10-02 ENCOUNTER — Encounter: Payer: Self-pay | Admitting: Family Medicine

## 2016-10-02 VITALS — BP 110/70 | Ht 66.0 in | Wt 200.4 lb

## 2016-10-02 DIAGNOSIS — K2900 Acute gastritis without bleeding: Secondary | ICD-10-CM | POA: Diagnosis not present

## 2016-10-02 NOTE — Progress Notes (Signed)
   Subjective:    Patient ID: Kelly Henson, female    DOB: 07/09/1965, 51 y.o.   MRN: [id number]  Abdominal Pain  This is a recurrent problem. The current episode started more than 1 month ago. The pain is located in the epigastric region. Treatments tried: Omeprazole.   Still experiencing challenges with reflux. Has been using omeprazole cell. Somewhat better but still a challenge. Some burning and epigastric discomfort intermittently. But overall better.  Also has not had a colonoscopy. Recently turned age 51.   Patient has concerns of bright spots in vision field. Gets a cescent like image in the vision. Not assoc with headache , last couple minutes due to see eye doctor soon. No associated headache with a brief visual defect   Day num [phone] Review of Systems  Gastrointestinal: Positive for abdominal pain.       Objective:   Physical Exam  Alert vitals stable HEENT normal lungs clear heart rare rhythm no CA tenderness abdomen slight epigastric tenderness to deep palpation no rebound no guarding        Assessment & Plan:

## 2016-10-06 ENCOUNTER — Encounter: Payer: Self-pay | Admitting: Family Medicine

## 2016-10-10 ENCOUNTER — Encounter (INDEPENDENT_AMBULATORY_CARE_PROVIDER_SITE_OTHER): Payer: Self-pay | Admitting: Internal Medicine

## 2016-11-05 ENCOUNTER — Ambulatory Visit (INDEPENDENT_AMBULATORY_CARE_PROVIDER_SITE_OTHER): Payer: BLUE CROSS/BLUE SHIELD | Admitting: Urology

## 2016-11-05 DIAGNOSIS — R350 Frequency of micturition: Secondary | ICD-10-CM | POA: Diagnosis not present

## 2016-11-05 DIAGNOSIS — R3129 Other microscopic hematuria: Secondary | ICD-10-CM

## 2016-11-07 ENCOUNTER — Other Ambulatory Visit: Payer: Self-pay | Admitting: Urology

## 2016-11-07 ENCOUNTER — Ambulatory Visit (INDEPENDENT_AMBULATORY_CARE_PROVIDER_SITE_OTHER): Payer: BLUE CROSS/BLUE SHIELD | Admitting: Internal Medicine

## 2016-11-07 DIAGNOSIS — R3129 Other microscopic hematuria: Secondary | ICD-10-CM

## 2016-11-24 ENCOUNTER — Other Ambulatory Visit: Payer: Self-pay | Admitting: Family Medicine

## 2016-11-24 ENCOUNTER — Ambulatory Visit (HOSPITAL_COMMUNITY)
Admission: RE | Admit: 2016-11-24 | Discharge: 2016-11-24 | Disposition: A | Discharge status: Home / Self Care | Payer: BLUE CROSS/BLUE SHIELD | Source: Ambulatory Visit | Attending: Urology | Admitting: Urology

## 2016-11-24 DIAGNOSIS — Z9049 Acquired absence of other specified parts of digestive tract: Secondary | ICD-10-CM | POA: Diagnosis not present

## 2016-11-24 DIAGNOSIS — I70208 Unspecified atherosclerosis of native arteries of extremities, other extremity: Secondary | ICD-10-CM | POA: Diagnosis not present

## 2016-11-24 DIAGNOSIS — N200 Calculus of kidney: Secondary | ICD-10-CM | POA: Diagnosis not present

## 2016-11-24 DIAGNOSIS — I700 Atherosclerosis of aorta: Secondary | ICD-10-CM | POA: Diagnosis not present

## 2016-11-24 DIAGNOSIS — M5137 Other intervertebral disc degeneration, lumbosacral region: Secondary | ICD-10-CM | POA: Insufficient documentation

## 2016-11-24 DIAGNOSIS — R3129 Other microscopic hematuria: Secondary | ICD-10-CM | POA: Insufficient documentation

## 2016-11-24 DIAGNOSIS — R350 Frequency of micturition: Secondary | ICD-10-CM

## 2016-11-24 MED ORDER — IOPAMIDOL (ISOVUE-300) INJECTION 61%
125.0000 mL | Freq: Once | INTRAVENOUS | Status: AC | PRN
  Administered 2016-11-24: 125 mL via INTRAVENOUS

## 2016-12-03 ENCOUNTER — Encounter: Payer: Self-pay | Admitting: Family Medicine

## 2016-12-03 ENCOUNTER — Other Ambulatory Visit: Payer: Self-pay | Admitting: Family Medicine

## 2016-12-03 ENCOUNTER — Ambulatory Visit (INDEPENDENT_AMBULATORY_CARE_PROVIDER_SITE_OTHER): Payer: BLUE CROSS/BLUE SHIELD | Admitting: Family Medicine

## 2016-12-03 VITALS — BP 120/84 | Temp 98.4°F | Ht 66.0 in | Wt 203.0 lb

## 2016-12-03 DIAGNOSIS — M79605 Pain in left leg: Secondary | ICD-10-CM

## 2016-12-03 DIAGNOSIS — M79604 Pain in right leg: Secondary | ICD-10-CM

## 2016-12-03 NOTE — Progress Notes (Signed)
   Subjective:    Patient ID: Adonis HousekeeperCynthia C Raffo, female    DOB: 12/11/1965, 51 y.o.   MRN: [id number]  Leg Pain   The incident occurred more than 1 week ago. There was no injury mechanism. The pain is present in the left leg and right leg. The quality of the pain is described as aching (Heaviness).   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   Pos smoker  fam hx arthritis in mon   Taking tyle once per day anywhere fro tow to four   Wondered re perip ar t disease  takes tylenol and ibu prn worie dabout tyl  Review of Systems No headache, no major weight loss or weight gain, no chest pain no back pain abdominal pain no change in bowel habits complete ROS otherwise negative     Objective:   Physical Exam Alert and oriented, vitals reviewed and stable, NAD ENT-TM's and ext canals WNL bilat via otoscopic exam Soft palate, tonsils and post pharynx WNL via oropharyngeal exam Neck-symmetric, no masses; thyroid nonpalpable and nontender Pulmonary-no tachypnea or accessory muscle use; Clear without wheezes via auscultation Card--no abnrml murmurs, rhythm reg and rate WNL Carotid pulses symmetric, without bruits Lung really really       Assessment & Plan:  Leg pain. With concern regarding arterial blood flow. Doubt that is a consideration but will check to be on the safe side. Discussed

## 2016-12-03 NOTE — Progress Notes (Deleted)
   Subjective:    Patient ID: Kelly Henson, female    DOB: 10/03/1965, 51 y.o.   MRN: [id number]  HPI    Review of Systems     Objective:   Physical Exam        Assessment & Plan:

## 2016-12-05 ENCOUNTER — Ambulatory Visit (HOSPITAL_COMMUNITY)
Admission: RE | Admit: 2016-12-05 | Discharge: 2016-12-05 | Disposition: A | Discharge status: Home / Self Care | Payer: BLUE CROSS/BLUE SHIELD | Source: Ambulatory Visit | Attending: Family Medicine | Admitting: Family Medicine

## 2016-12-05 DIAGNOSIS — M79605 Pain in left leg: Secondary | ICD-10-CM | POA: Diagnosis not present

## 2016-12-05 DIAGNOSIS — M79604 Pain in right leg: Secondary | ICD-10-CM | POA: Diagnosis not present

## 2016-12-05 DIAGNOSIS — I70213 Atherosclerosis of native arteries of extremities with intermittent claudication, bilateral legs: Secondary | ICD-10-CM | POA: Diagnosis not present

## 2016-12-05 MED ORDER — DICLOFENAC SODIUM 75 MG PO TBEC
75.0000 mg | DELAYED_RELEASE_TABLET | Freq: Two times a day (BID) | ORAL | 2 refills | Status: DC
Start: 2016-12-05 — End: 2017-08-07

## 2016-12-11 ENCOUNTER — Telehealth (INDEPENDENT_AMBULATORY_CARE_PROVIDER_SITE_OTHER): Payer: Self-pay | Admitting: *Deleted

## 2016-12-11 ENCOUNTER — Other Ambulatory Visit (INDEPENDENT_AMBULATORY_CARE_PROVIDER_SITE_OTHER): Payer: Self-pay | Admitting: Internal Medicine

## 2016-12-11 ENCOUNTER — Encounter (INDEPENDENT_AMBULATORY_CARE_PROVIDER_SITE_OTHER): Payer: Self-pay | Admitting: Internal Medicine

## 2016-12-11 ENCOUNTER — Ambulatory Visit (INDEPENDENT_AMBULATORY_CARE_PROVIDER_SITE_OTHER): Payer: BLUE CROSS/BLUE SHIELD | Admitting: Internal Medicine

## 2016-12-11 ENCOUNTER — Encounter (INDEPENDENT_AMBULATORY_CARE_PROVIDER_SITE_OTHER): Payer: Self-pay | Admitting: *Deleted

## 2016-12-11 VITALS — BP 140/84 | HR 80 | Temp 98.5°F | Ht 66.0 in | Wt 199.7 lb

## 2016-12-11 DIAGNOSIS — R1013 Epigastric pain: Secondary | ICD-10-CM

## 2016-12-11 DIAGNOSIS — Z1211 Encounter for screening for malignant neoplasm of colon: Secondary | ICD-10-CM | POA: Insufficient documentation

## 2016-12-11 MED ORDER — PEG 3350-KCL-NA BICARB-NACL 420 G PO SOLR: 4000.0000 mL | Freq: Once | ORAL | 0 refills | Status: AC

## 2016-12-11 NOTE — Progress Notes (Signed)
   Subjective:    Patient ID: Kelly Henson, female    DOB: 05/18/1966, 51 y.o.   MRN: [id number]  HPID=Referred by Dr. Gerda DissLuking for epigastric pain. She was started on Omeprazole and pain resolved. Had had pain for months.  Also needs a colonoscopy. Appetite is good. No weight loss. Has a BM x 1 a day. No melena or BRRB No family hx of colon cancer.    Review of Systems  Past Medical History:  Diagnosis Date  . Acid reflux   . Allergy   . Recurrent boils   . Thyroid disease     Past Surgical History:  Procedure Laterality Date  . cyst removed    . DILATION AND CURETTAGE OF UTERUS    . TUBAL LIGATION      No Known Allergies  Current Outpatient Prescriptions on File Prior to Visit  Medication Sig Dispense Refill  . cetirizine (ZYRTEC) 10 MG tablet Take 10 mg by mouth daily.    . diclofenac (VOLTAREN) 75 MG EC tablet Take 1 tablet (75 mg total) by mouth 2 (two) times daily. With food as needed for pain 28 tablet 2  . levothyroxine (SYNTHROID, LEVOTHROID) 125 MCG tablet Take 1 tablet (125 mcg total) by mouth daily. 90 tablet 1  . loratadine (CLARITIN) 10 MG tablet Take 10 mg by mouth daily.    . Multiple Vitamin (MULTIVITAMIN) tablet Take 1 tablet by mouth daily.    Marland Kitchen. omeprazole (PRILOSEC) 40 MG capsule TAKE 1 CAPSULE BY MOUTH ONCE DAILY 30 capsule 2   No current facility-administered medications on file prior to visit.         Objective:   Physical Exam Blood pressure 140/84, pulse 80, temperature 98.5 F (36.9 C), height 5\' 6"  (1.676 m), weight 199 lb 11.2 oz (90.6 kg). Alert and oriented. Skin warm and dry. Oral mucosa is moist.   . Sclera anicteric, conjunctivae is pink. Thyroid not enlarged. No cervical lymphadenopathy. Lungs clear. Heart regular rate and rhythm.  Abdomen is soft. Bowel sounds are positive. No hepatomegaly. No abdominal masses felt. No tenderness.  No edema to lower extremities.           Assessment & Plan:  Epigastric pain. PUD needs to be  ruled out.  EGD Screening colonoscopy.

## 2016-12-11 NOTE — Telephone Encounter (Signed)
Patient needs trilyte 

## 2016-12-12 ENCOUNTER — Ambulatory Visit (INDEPENDENT_AMBULATORY_CARE_PROVIDER_SITE_OTHER): Payer: BLUE CROSS/BLUE SHIELD | Admitting: Family Medicine

## 2016-12-12 ENCOUNTER — Encounter: Payer: Self-pay | Admitting: Family Medicine

## 2016-12-12 VITALS — BP 110/80 | Ht 66.0 in | Wt 200.2 lb

## 2016-12-12 DIAGNOSIS — M79604 Pain in right leg: Secondary | ICD-10-CM | POA: Diagnosis not present

## 2016-12-12 DIAGNOSIS — M79605 Pain in left leg: Secondary | ICD-10-CM

## 2016-12-12 NOTE — Progress Notes (Signed)
   Subjective:    Patient ID: Kelly Henson, female    DOB: 07/25/1965, 51 y.o.   MRN: [id number]  Leg Pain   The incident occurred more than 1 week ago. The pain is present in the left leg and right leg.    Patient in today for a 8 days follow up on leg pain .  Patient states overall leg pain is better. It is symmetric. Deep in the knees. Worse with certain motions.  Significant improvement compared to last week.  On further history no major exertional component.  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 Blood pressure good on repeat. HEENT normal. Lungs clear. Heart regular rate and rhythm. Ankles pulses good sensation intact knees minimal crepitation no effusion good range of motion      Assessment & Plan:  Impression musculoskeletal pain clinically improved plan encouraged to stop smoking diet exercise discussed in encourage  Greater than 50% of this 15 minute face to face visit was spent in counseling and discussion and coordination of care regarding the above diagnosis/diagnosies

## 2017-01-21 ENCOUNTER — Ambulatory Visit: Payer: BLUE CROSS/BLUE SHIELD | Admitting: Urology

## 2017-03-01 ENCOUNTER — Other Ambulatory Visit: Payer: Self-pay | Admitting: Family Medicine

## 2017-03-01 DIAGNOSIS — R350 Frequency of micturition: Secondary | ICD-10-CM

## 2017-03-24 ENCOUNTER — Telehealth (INDEPENDENT_AMBULATORY_CARE_PROVIDER_SITE_OTHER): Payer: Self-pay | Admitting: *Deleted

## 2017-03-24 NOTE — Telephone Encounter (Signed)
Patient spoke to [facility] in ENDO -- pt is cancelling TCS/EGD tomorrow, she has started a new job and will call back to reschedule once she is able

## 2017-03-24 NOTE — OR Nursing (Signed)
Called patient for pre-op phone call. Patient stated, "I'm going to have to reschedule. I just started and new job and am in my 90 days." Informed patient to call Dr. Patty Sermons office when she is ready to reschedule procedure. Luster Landsberg notified.

## 2017-03-25 ENCOUNTER — Encounter (HOSPITAL_COMMUNITY): Admission: RE | Payer: Self-pay | Source: Ambulatory Visit

## 2017-03-25 ENCOUNTER — Ambulatory Visit (HOSPITAL_COMMUNITY)
Admission: RE | Admit: 2017-03-25 | Payer: BLUE CROSS/BLUE SHIELD | Source: Ambulatory Visit | Admitting: Internal Medicine

## 2017-03-25 SURGERY — EGD (ESOPHAGOGASTRODUODENOSCOPY)
Anesthesia: Moderate Sedation

## 2017-03-25 NOTE — Telephone Encounter (Signed)
noted 

## 2017-06-24 ENCOUNTER — Other Ambulatory Visit: Payer: Self-pay | Admitting: Family Medicine

## 2017-06-24 DIAGNOSIS — R350 Frequency of micturition: Secondary | ICD-10-CM

## 2017-07-27 DIAGNOSIS — S61214A Laceration without foreign body of right ring finger without damage to nail, initial encounter: Secondary | ICD-10-CM | POA: Diagnosis not present

## 2017-07-27 DIAGNOSIS — Z6824 Body mass index (BMI) 24.0-24.9, adult: Secondary | ICD-10-CM | POA: Diagnosis not present

## 2017-08-07 ENCOUNTER — Encounter: Payer: Self-pay | Admitting: Family Medicine

## 2017-08-07 ENCOUNTER — Ambulatory Visit (INDEPENDENT_AMBULATORY_CARE_PROVIDER_SITE_OTHER): Payer: BLUE CROSS/BLUE SHIELD | Admitting: Family Medicine

## 2017-08-07 VITALS — BP 102/70 | Ht 66.0 in | Wt 153.0 lb

## 2017-08-07 DIAGNOSIS — L03011 Cellulitis of right finger: Secondary | ICD-10-CM | POA: Diagnosis not present

## 2017-08-07 MED ORDER — CEPHALEXIN 500 MG PO CAPS: 500.0000 mg | ORAL_CAPSULE | Freq: Three times a day (TID) | ORAL | 0 refills | Status: DC

## 2017-08-07 NOTE — Progress Notes (Signed)
   Subjective:    Patient ID: Kelly Henson, female    DOB: 07/30/1965, 52 y.o.   MRN: [id number]        Patient is here today to have a stitch removed from  Pinky finger on right hand.She says she slammed it the [place] 2 Mondays ago.  Received 7 stitches then.  Did not get a tetanus shot.  Did not get antibiotics.  No frank discharge but ongoing tenderness.  Perhaps a bit more tender the last few days   Review of Systems No headache, no major weight loss or weight gain, no chest pain no back pain abdominal pain no change in bowel habits complete ROS otherwise negative     Objective:   Physical Exam  Alert vitals stable, NAD. Blood pressure good on repeat. HEENT normal. Lungs clear. Heart regular rate and rhythm. Finger multiple sutures present somewhat erythematous distinctly sensitive and tender to palpation      Assessment & Plan:  Impression sutures removed also element of finger cellulitis.  Antibiotics prescribed.  Local measures discussed

## 2017-08-24 ENCOUNTER — Ambulatory Visit: Payer: BLUE CROSS/BLUE SHIELD | Admitting: Nurse Practitioner

## 2017-08-24 ENCOUNTER — Encounter: Payer: Self-pay | Admitting: Nurse Practitioner

## 2017-08-24 VITALS — BP 118/72 | Ht 66.0 in | Wt 151.6 lb

## 2017-08-24 DIAGNOSIS — M7662 Achilles tendinitis, left leg: Secondary | ICD-10-CM

## 2017-08-24 DIAGNOSIS — M7752 Other enthesopathy of left foot: Secondary | ICD-10-CM | POA: Diagnosis not present

## 2017-08-24 MED ORDER — MELOXICAM 7.5 MG PO TABS: ORAL_TABLET | ORAL | 0 refills | Status: DC

## 2017-08-24 NOTE — Progress Notes (Signed)
Subjective: Presents for complaints of pain in the left heel and posterior ankle area for the past 2 weeks.  Thinks it began after wearing a new pair of shoes at work for couple of days.  After this went back to her old shoes.  No specific history of injury.  Wears good quality athletic shoes.  Does a great deal of walking at work.  Objective:   BP 118/72   Ht 5\' 6"  (1.676 m)   Wt 151 lb 9.6 oz (68.8 kg)   BMI 24.47 kg/m  NAD.  Alert, oriented.  Lungs clear.  Heart regular rate and rhythm.  Left ankle no edema noted.  Minimal joint laxity, no difference with the right side.  Tenderness noted with flexion of the left ankle which is slightly limited.  Produces pain in the posterior heel and Achilles tendon area.  Mild tenderness with palpation.  Gait normal limit.  Has a very high arch to her foot.  Significant bunion noted on the left foot.  Assessment:  Achilles tendinitis of left lower extremity  Left calcaneal bursitis    Plan:   Meds ordered this encounter  Medications  . meloxicam (MOBIC) 7.5 MG tablet    Sig: Take one po BID prn pain    Dispense:  30 tablet    Refill:  0    Order Specific Question:   Supervising Provider    Answer:   Merlyn AlbertLUKING, WILLIAM S [[id number]]   Neoprene ankle support.  Given written information on Achilles tendinitis and bursitis.  Strongly recommend high-quality athletic shoes, given information on a local running shoe store.  Call back in 2 weeks if no improvement, sooner if worse.

## 2017-08-24 NOTE — Patient Instructions (Addendum)
Neoprene ankle support  [email or web address]   Achilles Tendinitis Rehab Ask your health care provider which exercises are safe for you. Do exercises exactly as told by your health care provider and adjust them as directed. It is normal to feel mild stretching, pulling, tightness, or discomfort as you do these exercises, but you should stop right away if you feel sudden pain or your pain gets worse. Do not begin these exercises until told by your health care provider. Stretching and range of motion exercises These exercises warm up your muscles and joints and improve the movement and flexibility of your ankle. These exercises also help to relieve pain, numbness, and tingling. Exercise A: Standing wall calf stretch, knee straight  1. Stand with your hands against a wall. 2. Extend your __________ leg behind you and bend your front knee slightly. Keep both of your heels on the floor. 3. Point the toes of your back foot slightly inward. 4. Keeping your heels on the floor and your back knee straight, shift your weight toward the wall. Do not allow your back to arch. You should feel a gentle stretch in your calf. 5. Hold this position for seconds. Repeat __________ times. Complete this stretch __________ times per day. Exercise B: Standing wall calf stretch, knee bent 1. Stand with your hands against a wall. 2. Extend your __________ leg behind you, and bend your front knee slightly. Keep both of your heels on the floor. 3. Point the toes of your back foot slightly inward. 4. Keeping your heels on the floor, unlock your back knee so that it is bent. You should feel a gentle stretch deep in your calf. 5. Hold this position for __________ seconds. Repeat __________ times. Complete this stretch __________ times per day. Strengthening exercises These exercises build strength and control of your ankle. Endurance is the ability to use your muscles for a long time, even after they get tired. Exercise C:  Plantar flexion with band  1. Sit on the floor with your __________ leg extended. You may put a pillow under your calf to give your foot more room to move. 2. Loop a rubber exercise band or tube around the ball of your __________ foot. The ball of your foot is on the walking surface, right under your toes. The band or tube should be slightly tense when your foot is relaxed. If the band or tube slips, you can put on your shoe or put a washcloth between the band and your foot to help it stay in place. 3. Slowly point your toes downward, pushing them away from you. 4. Hold this position for __________ seconds. 5. Slowly release the tension in the band or tube, controlling smoothly until your foot is back to the starting position. Repeat __________ times. Complete this exercise __________ times per day. Exercise D: Heel raise with eccentric lower  1. Stand on a step with the balls of your feet. The ball of your foot is on the walking surface, right under your toes. ? Do not put your heels on the step. ? For balance, rest your hands on the wall or on a railing. 2. Rise up onto the balls of your feet. 3. Keeping your heels up, shift all of your weight to your __________ leg and pick up your other leg. 4. Slowly lower your __________ leg so your heel drops below the level of the step. 5. Put down your foot. If told by your health care provider, build up to:  3 sets  of 15 repetitions while keeping your knees straight.  3 sets of 15 repetitions while keeping your knees bent as far as told by your health care provider.  Complete this exercise __________ times per day. If this exercise is too easy, try doing it while wearing a backpack with weights in it. Balance exercises These exercises improve or maintain your balance. Balance is important in preventing falls. Exercise E: Single leg stand 1. Without shoes, stand near a railing or in a door frame. Hold on to the railing or door frame as  needed. 2. Stand on your __________ foot. Keep your big toe down on the floor and try to keep your arch lifted. 3. Hold this position for __________ seconds. Repeat __________ times. Complete this exercise __________ times per day. If this exercise is too easy, you can try it with your eyes closed or while standing on a pillow. This information is not intended to replace advice given to you by your health care provider. Make sure you discuss any questions you have with your health care provider. Document Released: 01/22/2005 Document Revised: 02/28/2016 Document Reviewed: 02/27/2015 Elsevier Interactive Patient Education  2018 [employer] Inc.  Achilles Tendinitis Achilles tendinitis is inflammation of the tough, cord-like band that attaches the lower leg muscles to the heel bone (Achilles tendon). This is usually caused by overusing the tendon and the ankle joint. Achilles tendinitis usually gets better over time with treatment and caring for yourself at home. It can take weeks or months to heal completely. What are the causes? This condition may be caused by:  A sudden increase in exercise or activity, such as running.  Doing the same exercises or activities (such as jumping) over and over.  Not warming up calf muscles before exercising.  Exercising in shoes that are worn out or not made for exercise.  Having arthritis or a bone growth (spur) on the back of the heel bone. This can rub against the tendon and hurt it.  Age-related wear and tear. Tendons become less flexible with age and more likely to be injured.  What are the signs or symptoms? Common symptoms of this condition include:  Pain in the Achilles tendon or in the back of the leg, just above the heel. The pain usually gets worse with exercise.  Stiffness or soreness in the back of the leg, especially in the morning.  Swelling of the skin over the Achilles tendon.  Thickening of the tendon.  Bone spurs at the bottom of  the Achilles tendon, near the heel.  Trouble standing on tiptoe.  How is this diagnosed? This condition is diagnosed based on your symptoms and a physical exam. You may have tests, including:  X-rays.  MRI.  How is this treated? The goal of treatment is to relieve symptoms and help your injury heal. Treatment may include:  Decreasing or stopping activities that caused the tendinitis. This may mean switching to low-impact exercises like biking or swimming.  Icing the injured area.  Doing physical therapy, including strengthening and stretching exercises.  NSAIDs to help relieve pain and swelling.  Using supportive shoes, wraps, heel lifts, or a walking boot (air cast).  Surgery. This may be done if your symptoms do not improve after 6 months.  Using high-energy shock wave impulses to stimulate the healing process (extracorporeal shock wave therapy). This is rare.  Injection of medicines to help relieve inflammation (corticosteroids). This is rare.  Follow these instructions at home: If you have an air cast:  Wear the cast as told by your health care provider. Remove it only as told by your health care provider.  Loosen the cast if your toes tingle, become numb, or turn cold and blue. Activity  Gradually return to your normal activities once your health care provider approves. Do not do activities that cause pain. ? Consider doing low-impact exercises, like cycling or swimming.  If you have an air cast, ask your health care provider when it is safe for you to drive.  If physical therapy was prescribed, do exercises as told by your health care provider or physical therapist. Managing pain, stiffness, and swelling  Raise (elevate) your foot above the level of your heart while you are sitting or lying down.  Move your toes often to avoid stiffness and to lessen swelling.  If directed, put ice on the injured area: ? Put ice in a plastic bag. ? Place a towel between your  skin and the bag. ? Leave the ice on for 20 minutes, 2-3 times a day General instructions  If directed, wrap your foot with an elastic bandage or other wrap. This can help keep your tendon from moving too much while it heals. Your health care provider will show you how to wrap your foot correctly.  Wear supportive shoes or heel lifts only as told by your health care provider.  Take over-the-counter and prescription medicines only as told by your health care provider.  Keep all follow-up visits as told by your health care provider. This is important. Contact a health care provider if:  You have symptoms that gets worse.  You have pain that does not get better with medicine.  You develop new, unexplained symptoms.  You develop warmth and swelling in your foot.  You have a fever. Get help right away if:  You have a sudden popping sound or sensation in your Achilles tendon followed by severe pain.  You cannot move your toes or foot.  You cannot put any weight on your foot. Summary  Achilles tendinitis is inflammation of the tough, cord-like band that attaches the lower leg muscles to the heel bone (Achilles tendon).  This condition is usually caused by overusing the tendon and the ankle joint. It can also be caused by arthritis or normal aging.  The most common symptoms of this condition include pain, swelling, or stiffness in the Achilles tendon or in the back of the leg.  This condition is usually treated with rest, NSAIDs, and physical therapy. This information is not intended to replace advice given to you by your health care provider. Make sure you discuss any questions you have with your health care provider. Document Released: 04/02/2005 Document Revised: 05/12/2016 Document Reviewed: 05/12/2016 Elsevier Interactive Patient Education  2017 [employer].

## 2017-10-12 ENCOUNTER — Telehealth: Payer: Self-pay | Admitting: Family Medicine

## 2017-10-12 MED ORDER — LEVOTHYROXINE SODIUM 125 MCG PO TABS: 125.0000 ug | ORAL_TABLET | Freq: Every day | ORAL | 1 refills | Status: DC

## 2017-10-12 NOTE — Telephone Encounter (Signed)
Ok thru july

## 2017-10-12 NOTE — Telephone Encounter (Signed)
Last seen for hypothyroid July 2017 and last tsh July 2017

## 2017-10-12 NOTE — Telephone Encounter (Signed)
Patient is requesting a prescription for levothyroxine 125 mg called into [facility] lasted filled 07/08/2016

## 2017-10-12 NOTE — Telephone Encounter (Signed)
Prescription sent electronically to pharmacy. Patient notified. 

## 2018-01-29 ENCOUNTER — Other Ambulatory Visit: Payer: Self-pay | Admitting: Family Medicine

## 2018-01-29 DIAGNOSIS — R350 Frequency of micturition: Secondary | ICD-10-CM

## 2018-02-03 ENCOUNTER — Ambulatory Visit: Payer: BLUE CROSS/BLUE SHIELD | Admitting: Nurse Practitioner

## 2018-06-13 ENCOUNTER — Other Ambulatory Visit: Payer: Self-pay | Admitting: Family Medicine

## 2018-08-02 ENCOUNTER — Encounter: Payer: Self-pay | Admitting: Family Medicine

## 2018-08-02 ENCOUNTER — Ambulatory Visit: Payer: BLUE CROSS/BLUE SHIELD | Admitting: Family Medicine

## 2018-08-02 VITALS — BP 114/72 | Temp 100.2°F | Ht 66.0 in | Wt 157.0 lb

## 2018-08-02 DIAGNOSIS — R202 Paresthesia of skin: Secondary | ICD-10-CM | POA: Diagnosis not present

## 2018-08-02 DIAGNOSIS — M545 Low back pain: Secondary | ICD-10-CM | POA: Diagnosis not present

## 2018-08-02 DIAGNOSIS — S39012A Strain of muscle, fascia and tendon of lower back, initial encounter: Secondary | ICD-10-CM

## 2018-08-02 DIAGNOSIS — G5601 Carpal tunnel syndrome, right upper limb: Secondary | ICD-10-CM | POA: Diagnosis not present

## 2018-08-02 DIAGNOSIS — M25531 Pain in right wrist: Secondary | ICD-10-CM

## 2018-08-02 MED ORDER — ETODOLAC 400 MG PO TABS: 400.0000 mg | ORAL_TABLET | Freq: Two times a day (BID) | ORAL | 0 refills | Status: DC

## 2018-08-02 NOTE — Progress Notes (Signed)
   Subjective:    Patient ID: Kelly Henson, female    DOB: 05-Feb-1966, 53 y.o.   MRN: [id number]  HPI Patient is here today with complaints of right wrist,lhand will get to swelling, then gets nimb , and hurts, going onnow for awhile. Worse lately.   hursmore when using wrist and hand a lot with work  Taking four advil at a time, then tyeol also     b Whole hand gets numbness  Night time pin seems to be the worse   ower back pain     , usually on left side, some rad into the leg, hurting for the past two weeks, recalls no initial injury,  Over the yrs no major back pain or discmfort   Some numb sensation in the right periscapular regionn    and eye pain.swollen in the morning, the eyelids are sore, pain and swelling off and on for the past six months , pt state concerned about it   Patient states here wrist has been hurting off and on for some time know. It can go numb,painful,swells and the pain radiates up her arm.  She is also complaining of lower back pain and is not having any urinary symptoms,hurts when she moves and she does not remember injuring it. On going for a couple of weeks.  She has been waking up right eye swollen shut and sore in the eye socket.  She has been taking Ibuprofen.  Review of Systems No headache, no major weight loss or weight gain, no chest pain no back pain abdominal pain no change in bowel habits complete ROS otherwise negative     Objective:   Physical Exam  Alert and oriented, vitals reviewed and stable, NAD ENT-TM's and ext canals WNL bilat via otoscopic exam Soft palate, tonsils and post pharynx WNL via oropharyngeal exam Neck-symmetric, no masses; thyroid nonpalpable and nontender Pulmonary-no tachypnea or accessory muscle use; Clear without wheezes via auscultation Card--no abnrml murmurs, rhythm reg and rate WNL Carotid pulses symmetric, without bruits Right hand and wrist positive Phalen sign.  Plus minus Tinel's  sign.  Blood flow excellent.  Spine nontender.  Positive pain.  Lumbar region left side.  Worse with deep palpation negative straight leg raise.  Ocular exam within normal limits at this moment      Assessment & Plan:  Impression 1 notalgia paresthetica discussed.  2.  Subacute lumbar strain.  Local measures discussed.  Anti-inflammatory medicine prescribed  3.  Probable right carpal tunnel syndrome.  Patient is been diagnosed in the past.  Try nighttime splints did not help.  Referral on to specialist.  Patient to consider human resources at work with potential connection to work, get back with [place] if not to go through [employer].  4.  Intermittent right eye edema.  Complicated by suboptimal tear duct.  Encouraged to get back with right  Greater than 50% of this 25 minute face to face visit was spent in counseling and discussion and coordination of care regarding the above diagnosis/diagnosies

## 2018-08-10 DIAGNOSIS — N390 Urinary tract infection, site not specified: Secondary | ICD-10-CM | POA: Diagnosis not present

## 2018-08-10 DIAGNOSIS — Z6825 Body mass index (BMI) 25.0-25.9, adult: Secondary | ICD-10-CM | POA: Diagnosis not present

## 2018-08-10 DIAGNOSIS — R300 Dysuria: Secondary | ICD-10-CM | POA: Diagnosis not present

## 2018-08-10 DIAGNOSIS — R319 Hematuria, unspecified: Secondary | ICD-10-CM | POA: Diagnosis not present

## 2018-08-15 IMAGING — CT CT ABD-PEL WO/W CM
3 of 12 series · 13 of 46 positions shown, 19 images · IV contrast (iopamidol)
Comparison: Abdominal ultrasound report from 05/05/2011

CLINICAL DATA: Polyuria for 2 months with microhematuria.

EXAM:
CT ABDOMEN AND PELVIS WITHOUT AND WITH CONTRAST
TECHNIQUE: Multidetector CT imaging of the abdomen and pelvis was performed
following the standard protocol before and following the bolus
administration of intravenous contrast.
CONTRAST:  125mL [id number] IOPAMIDOL ([id number]) INJECTION 61%

[Series 2: axial pre · axial · non-contrast · 0.79mm/px · z∈[-671,-451]mm · 5 of 90 slices shown]
[im 12/90  soft-tissue]
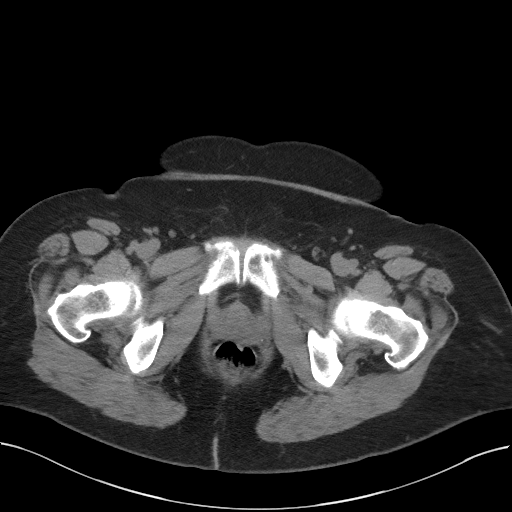
[im 23/90  soft-tissue]
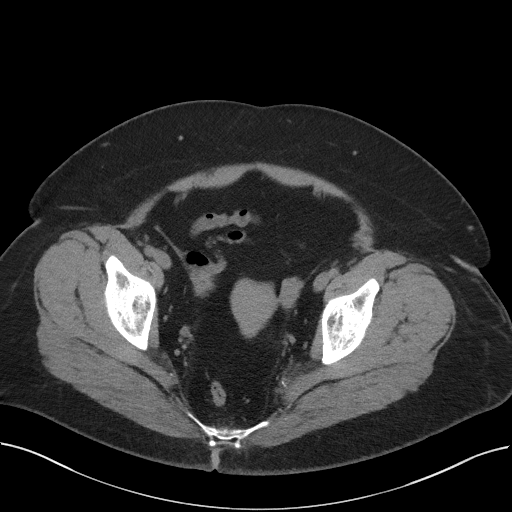
[im 34/90  soft-tissue]
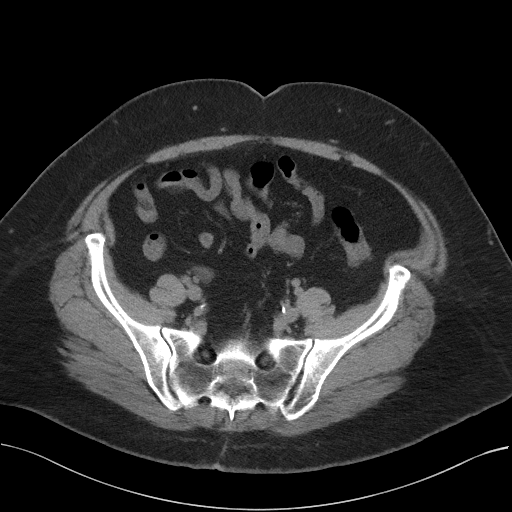
[im 45/90  soft-tissue]
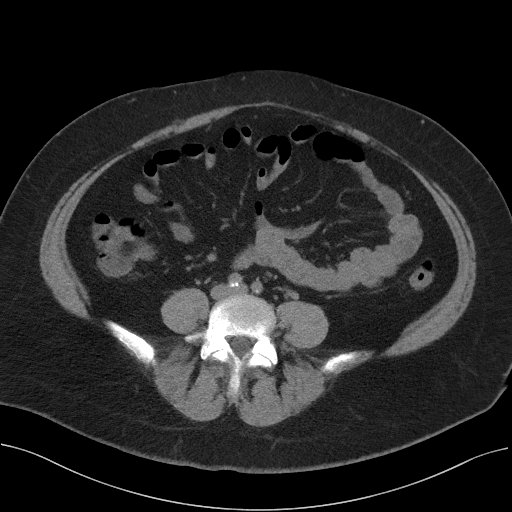
[im 56/90  soft-tissue]
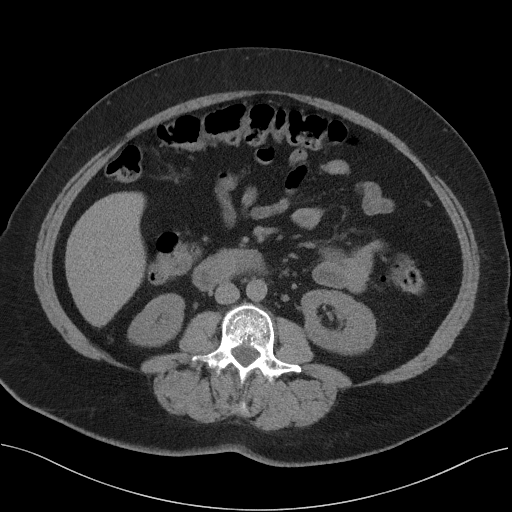

[Series 10: coronal post · coronal · 0.82mm/px · 2 of 110 slices shown, 3 images]
[im 37/110  soft-tissue]
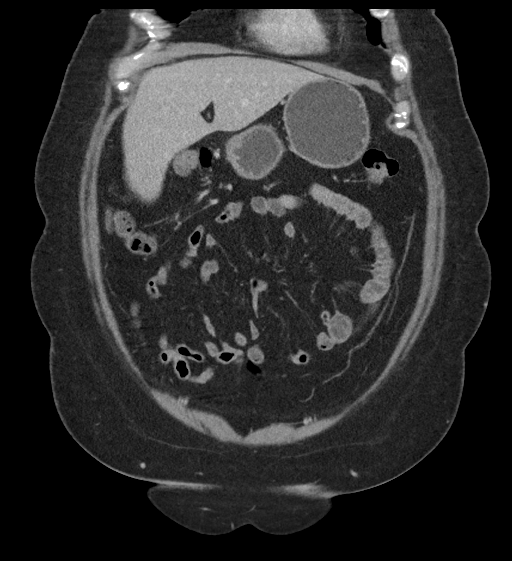
[im 37/110  bone]
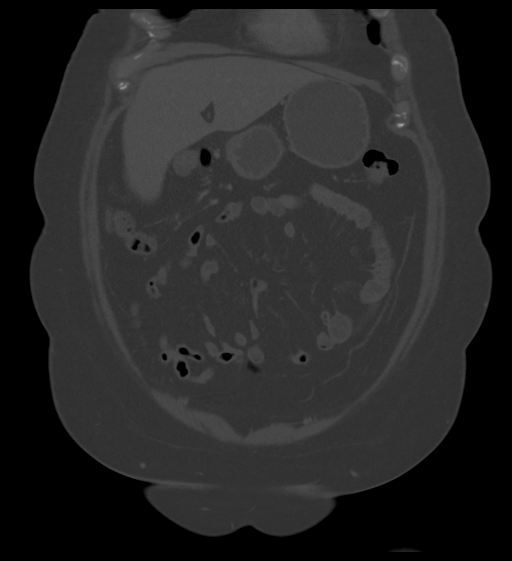
[im 73/110  soft-tissue]
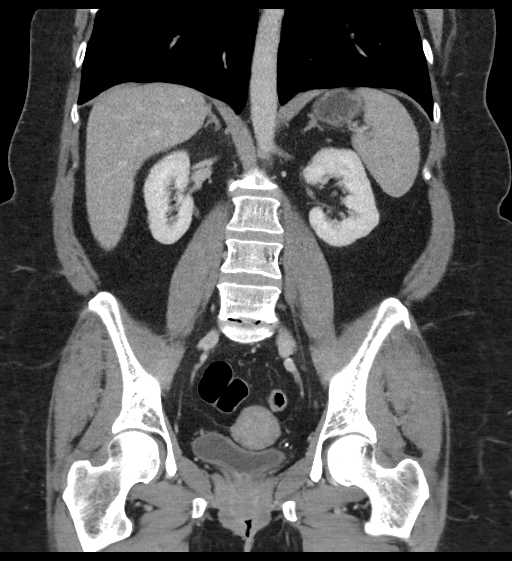

[Series 13: axial delay · axial · delayed · 0.88mm/px · z∈[-693,-343]mm · 6 of 98 slices shown, 11 images]
[im 14/98  soft-tissue]
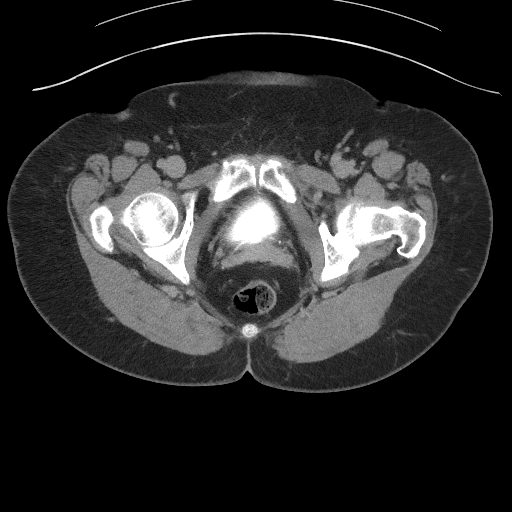
[im 14/98  bone]
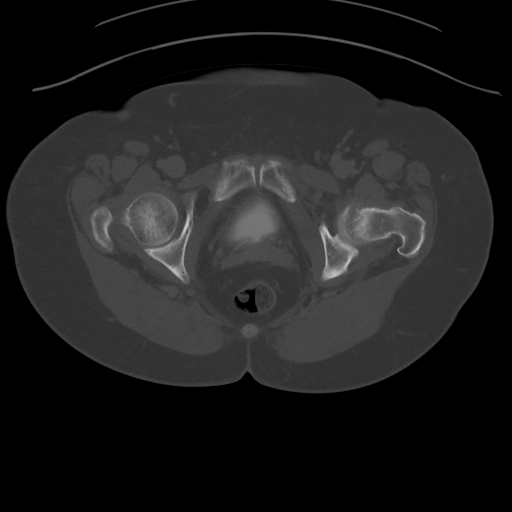
[im 28/98  soft-tissue]
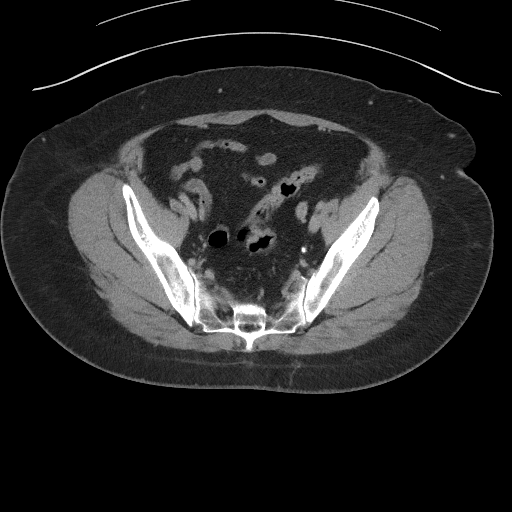
[im 42/98  soft-tissue]
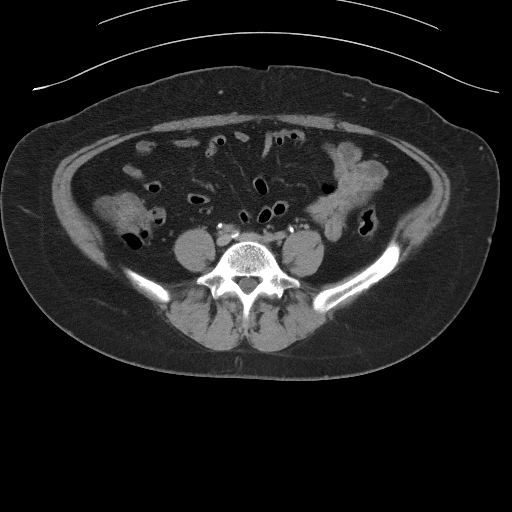
[im 42/98  lung]
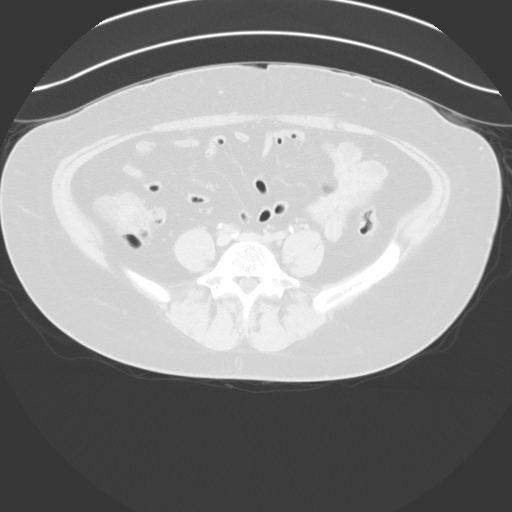
[im 56/98  soft-tissue]
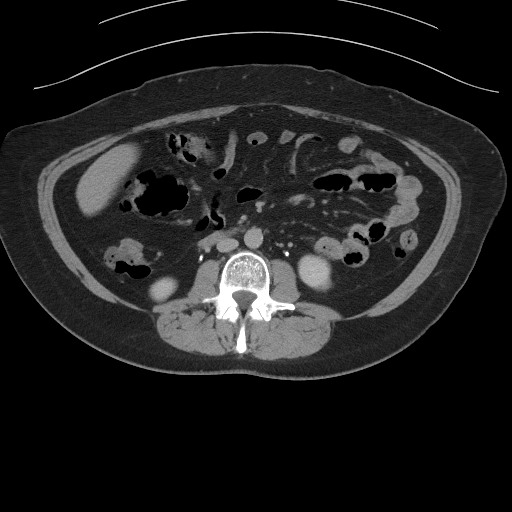
[im 56/98  lung]
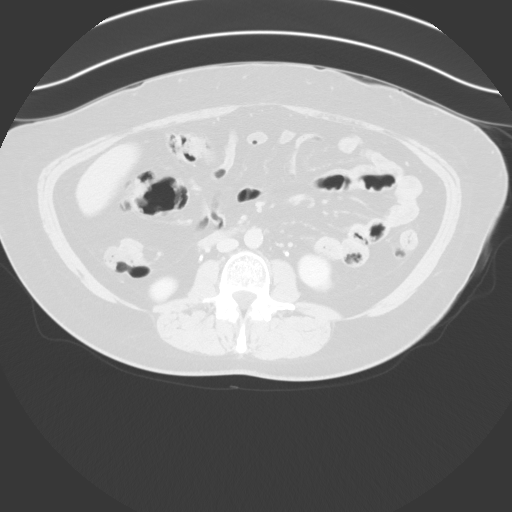
[im 70/98  soft-tissue]
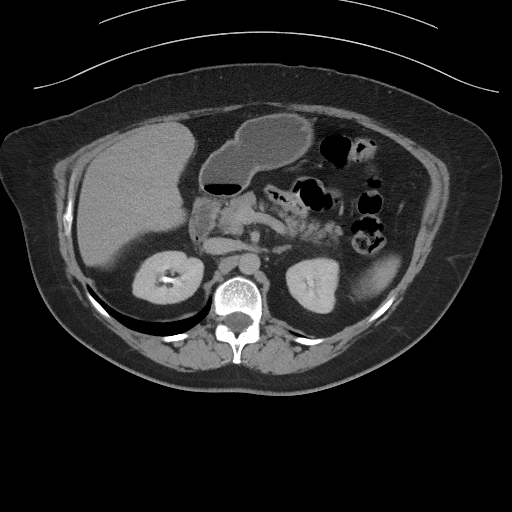
[im 70/98  lung]
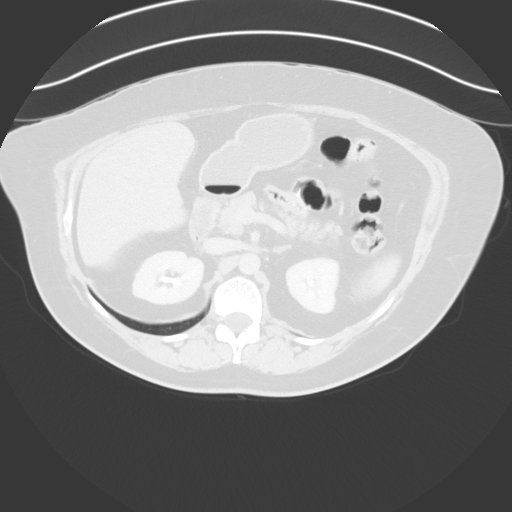
[im 84/98  soft-tissue]
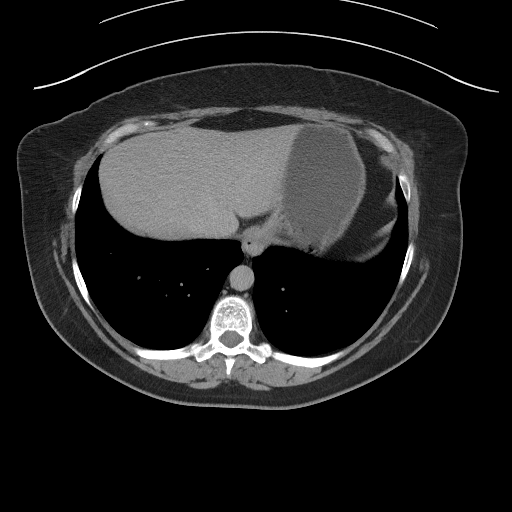
[im 84/98  lung]
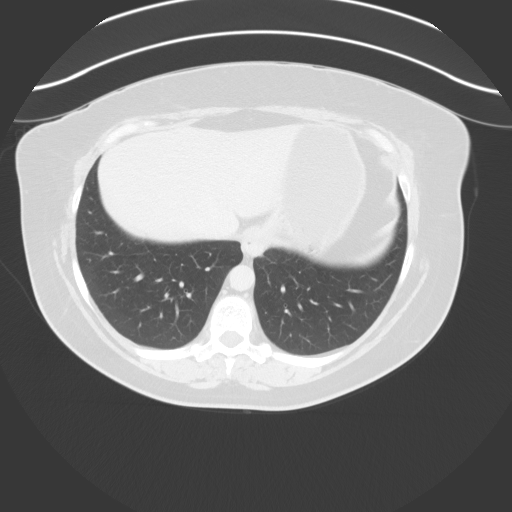

[13 of 46 positions shown; findings below may reference images not displayed]

FINDINGS: Lower chest: Unremarkable

Hepatobiliary: Cholecystectomy.  Otherwise unremarkable.

Pancreas: Unremarkable

Spleen: Unremarkable

Adrenals/Urinary Tract: 1 mm right mid kidney nonobstructive renal
calculus, image 81/8. No parenchymal mass of the kidneys identified.
No mass along the urothelium is observed.

Stomach/Bowel: Unremarkable

Vascular/Lymphatic: Aortoiliac atherosclerotic vascular disease.

Reproductive: Unremarkable

Other: No supplemental non-categorized findings.

Musculoskeletal: Degenerative disc disease at L5-S1 with borderline
left foraminal impingement at this level.
IMPRESSION: 1. 1 mm nonobstructive right mid kidney calculus. No other cause for
hematuria identified.
2.  Aortic Atherosclerosis ([id number]-AY4.4).

## 2018-08-23 ENCOUNTER — Encounter: Payer: BLUE CROSS/BLUE SHIELD | Admitting: Family Medicine

## 2018-08-27 ENCOUNTER — Encounter: Payer: BLUE CROSS/BLUE SHIELD | Admitting: Family Medicine

## 2018-10-07 ENCOUNTER — Other Ambulatory Visit: Payer: Self-pay | Admitting: *Deleted

## 2018-10-07 ENCOUNTER — Telehealth: Payer: Self-pay | Admitting: Family Medicine

## 2018-10-07 DIAGNOSIS — R350 Frequency of micturition: Secondary | ICD-10-CM

## 2018-10-07 MED ORDER — LEVOTHYROXINE SODIUM 125 MCG PO TABS: 125.0000 ug | ORAL_TABLET | Freq: Every day | ORAL | 0 refills | Status: DC

## 2018-10-07 MED ORDER — OMEPRAZOLE 40 MG PO CPDR: 40.0000 mg | DELAYED_RELEASE_CAPSULE | Freq: Every day | ORAL | 0 refills | Status: DC

## 2018-10-07 NOTE — Telephone Encounter (Signed)
Pt last seen 02/01/16 for hypothyroidism. Pt needing refill of levothyroxine 125 mg to be called in to [facility] [place]. Please advise. Thank you.

## 2018-10-07 NOTE — Telephone Encounter (Signed)
Patient requesting new prescription for levothyroxine 125 mg to be called into [place]

## 2018-10-07 NOTE — Telephone Encounter (Signed)
Pt requesting refill on omeprazole (PRILOSEC) 40 MG capsule   [facility] - [place], [place] - [address]

## 2018-10-07 NOTE — Telephone Encounter (Signed)
Medication sent in and pt is aware  

## 2018-10-07 NOTE — Telephone Encounter (Signed)
Three mo no blood  Work in long time rec o v in three mo

## 2018-10-08 NOTE — Telephone Encounter (Signed)
Refill sent. Pt notified.

## 2019-01-13 ENCOUNTER — Other Ambulatory Visit: Payer: Self-pay | Admitting: Family Medicine

## 2019-01-13 DIAGNOSIS — R350 Frequency of micturition: Secondary | ICD-10-CM

## 2019-04-18 ENCOUNTER — Other Ambulatory Visit: Payer: Self-pay | Admitting: Family Medicine

## 2019-04-18 DIAGNOSIS — R350 Frequency of micturition: Secondary | ICD-10-CM

## 2019-04-18 NOTE — Telephone Encounter (Signed)
Please contact pt to set up appt; then may route back to nurses. Thank you 

## 2019-04-22 NOTE — Telephone Encounter (Signed)
Phone visit scheduled for 05/18/2019 - please refill med, pt aware we will refill med

## 2019-05-15 ENCOUNTER — Other Ambulatory Visit: Payer: Self-pay | Admitting: Family Medicine

## 2019-05-18 ENCOUNTER — Ambulatory Visit (INDEPENDENT_AMBULATORY_CARE_PROVIDER_SITE_OTHER): Payer: BC Managed Care – PPO | Admitting: Family Medicine

## 2019-05-18 ENCOUNTER — Encounter: Payer: Self-pay | Admitting: Family Medicine

## 2019-05-18 ENCOUNTER — Other Ambulatory Visit: Payer: Self-pay

## 2019-05-18 DIAGNOSIS — Z131 Encounter for screening for diabetes mellitus: Secondary | ICD-10-CM | POA: Diagnosis not present

## 2019-05-18 DIAGNOSIS — Z13220 Encounter for screening for lipoid disorders: Secondary | ICD-10-CM | POA: Diagnosis not present

## 2019-05-18 DIAGNOSIS — E039 Hypothyroidism, unspecified: Secondary | ICD-10-CM | POA: Diagnosis not present

## 2019-05-18 MED ORDER — NITROFURANTOIN MONOHYD MACRO 100 MG PO CAPS: 100.0000 mg | ORAL_CAPSULE | Freq: Two times a day (BID) | ORAL | 0 refills | Status: DC

## 2019-05-18 MED ORDER — LEVOTHYROXINE SODIUM 125 MCG PO TABS: ORAL_TABLET | ORAL | 3 refills | Status: DC

## 2019-05-18 NOTE — Progress Notes (Signed)
   Subjective:    Patient ID: Kelly Henson, female    DOB: 1966/07/07, 53 y.o.   MRN: [id number]  HPI  Patient calls for a follow up on thyroid. Patient states that is going good.  Patient also states she has been having dysuria for a few days and has a history of UTI.  Virtual Visit via Video Note  I connected with Kelly Henson on 05/18/19 at 11:00 AM EST by a video enabled telemedicine application and verified that I am speaking with the correct person using two identifiers.  Location: Patient: home Provider: office   I discussed the limitations of evaluation and management by telemedicine and the availability of in person appointments. The patient expressed understanding and agreed to proceed.  History of Present Illness:    Observations/Objective:   Assessment and Plan:   Follow Up Instructions:    I discussed the assessment and treatment plan with the patient. The patient was provided an opportunity to ask questions and all were answered. The patient agreed with the plan and demonstrated an understanding of the instructions.   The patient was advised to call back or seek an in-person evaluation if the symptoms worsen or if the condition fails to improve as anticipated.  I provided79minutes of non-face-to-face time during this encounter.  Patient compliant with thyroid medication.  No symptoms of high or low thyroid.  Does not miss a dose.  Increased urinary frequency.  No dysuria.  No fever no chills.  Several days duration worsening.  Feels like a typical UTI   Review of Systems See above    Objective:   Physical Exam   Virtual     Assessment & Plan:  Impression hypothyroidism.  Medications refilled.  Appropriate blood work ordered.  2.  Probable UTI.  Discussed.  Antibiotics initiated warning signs discussed

## 2019-06-16 ENCOUNTER — Other Ambulatory Visit: Payer: Self-pay | Admitting: Family Medicine

## 2019-07-24 ENCOUNTER — Other Ambulatory Visit: Payer: Self-pay | Admitting: Family Medicine

## 2019-07-24 DIAGNOSIS — R350 Frequency of micturition: Secondary | ICD-10-CM

## 2019-08-10 ENCOUNTER — Encounter: Payer: Self-pay | Admitting: Family Medicine

## 2019-08-22 ENCOUNTER — Ambulatory Visit: Payer: BC Managed Care – PPO | Admitting: Family Medicine

## 2019-08-22 ENCOUNTER — Other Ambulatory Visit: Payer: Self-pay

## 2019-08-22 ENCOUNTER — Encounter: Payer: Self-pay | Admitting: Family Medicine

## 2019-08-22 VITALS — BP 128/80 | Temp 97.9°F | Ht 66.0 in | Wt 165.0 lb

## 2019-08-22 DIAGNOSIS — E039 Hypothyroidism, unspecified: Secondary | ICD-10-CM

## 2019-08-22 DIAGNOSIS — M791 Myalgia, unspecified site: Secondary | ICD-10-CM

## 2019-08-22 DIAGNOSIS — Z79899 Other long term (current) drug therapy: Secondary | ICD-10-CM

## 2019-08-22 DIAGNOSIS — Z13220 Encounter for screening for lipoid disorders: Secondary | ICD-10-CM

## 2019-08-22 NOTE — Progress Notes (Unsigned)
   Subjective:    Patient ID: Kelly Henson, female    DOB: [birth year], 54 y.o.   MRN: [id number]  HPInumbness in both legs for a couple of months. Happens when she is driving, sitting still and laying down. Burning sensation in legs sometimes.      Review of Systems     Objective:   Physical Exam        Assessment & Plan:

## 2019-08-22 NOTE — Progress Notes (Signed)
   Subjective:    Patient ID: Kelly Henson, female    DOB: Jan 10, 1966, 54 y.o.   MRN: [id number]  HPIbilateral leg numbness for several months. Happens when sitting down, laying down and driving.    Aching and numb n the legs  More and more frequently   Feels numbne in the lower legs  Numb in the feet alsolegs off and on hurt N ACHE  WORKS AT [employer], SHIFTS AR TEN HRS FIVE TO SIX DAYS PER WEEK   STANDING THRU THE SHIFT   Hx of cts, splints made it worse    Review of Systems No headache, no major weight loss or weight gain, no chest pain no back pain abdominal pain no change in bowel habits complete ROS otherwise negative     Objective:   Physical Exam  Alert vitals stable, NAD. Blood pressure good on repeat. HEENT normal. Lungs clear. Heart regular rate and rhythm. Excellent arterial pulses both feet and ankles.  Neurosensory exam completely intact.  Negative straight leg raise bilateral.  Knees cartilage good range of motion similar ankles      Assessment & Plan:  Impression intermittent leg pain with elements of numbness at times.  Exam reveals no obvious neurological diagnosis.  Discussed.  Neuro work-up not warranted at this time.  Symptom care discussed.  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  As needed ibuprofen discussed.  Appropriate blood work will add a sed rate and rheumatoid factor with family history of arthritis discussed

## 2019-08-23 LAB — HEPATIC FUNCTION PANEL
ALT: 13 IU/L (ref 0–32)
AST: 16 IU/L (ref 0–40)
Albumin: 4.5 g/dL (ref 3.8–4.9)
Alkaline Phosphatase: 90 IU/L (ref 39–117)
Bilirubin Total: <0.2 mg/dL (ref 0.0–1.2)
Bilirubin, Direct: 0.06 mg/dL (ref 0.00–0.40)
Total Protein: 6.8 g/dL (ref 6.0–8.5)

## 2019-08-23 LAB — BASIC METABOLIC PANEL
BUN/Creatinine Ratio: 15 (ref 9–23)
BUN: 11 mg/dL (ref 6–24)
CO2: 25 mmol/L (ref 20–29)
Calcium: 9.9 mg/dL (ref 8.7–10.2)
Chloride: 105 mmol/L (ref 96–106)
Creatinine, Ser: 0.73 mg/dL (ref 0.57–1.00)
GFR calc Af Amer: 109 mL/min/{1.73_m2} (ref >59)
GFR calc non Af Amer: 94 mL/min/{1.73_m2} (ref >59)
Glucose: 79 mg/dL (ref 65–99)
Potassium: 5.1 mmol/L (ref 3.5–5.2)
Sodium: 142 mmol/L (ref 134–144)

## 2019-08-23 LAB — LIPID PANEL
Chol/HDL Ratio: 2.8 ratio (ref 0.0–4.4)
Cholesterol, Total: 176 mg/dL (ref 100–199)
HDL: 63 mg/dL (ref >39)
LDL Chol Calc (NIH): 87 mg/dL (ref 0–99)
Triglycerides: 155 mg/dL — ABNORMAL HIGH (ref 0–149)
VLDL Cholesterol Cal: 26 mg/dL (ref 5–40)

## 2019-08-23 LAB — T4, FREE: Free T4: 1.73 ng/dL (ref 0.82–1.77)

## 2019-08-23 LAB — SEDIMENTATION RATE: Sed Rate: 2 mm/hr (ref 0–40)

## 2019-08-23 LAB — TSH: TSH: 0.067 u[IU]/mL — ABNORMAL LOW (ref 0.450–4.500)

## 2019-08-23 LAB — RHEUMATOID FACTOR: Rheumatoid fact SerPl-aCnc: <10 IU/mL (ref 0.0–13.9)

## 2019-08-26 ENCOUNTER — Ambulatory Visit: Payer: BC Managed Care – PPO | Admitting: Family Medicine

## 2019-08-26 MED ORDER — LEVOTHYROXINE SODIUM 112 MCG PO TABS: 112.0000 ug | ORAL_TABLET | Freq: Every day | ORAL | 1 refills | Status: DC

## 2019-08-26 NOTE — Addendum Note (Signed)
Addended by: Margaretha Sheffield on: 08/26/2019 03:56 PM   Modules accepted: Orders

## 2019-10-23 ENCOUNTER — Other Ambulatory Visit: Payer: Self-pay | Admitting: Family Medicine

## 2019-10-23 DIAGNOSIS — R350 Frequency of micturition: Secondary | ICD-10-CM

## 2019-12-12 ENCOUNTER — Telehealth (INDEPENDENT_AMBULATORY_CARE_PROVIDER_SITE_OTHER): Payer: BC Managed Care – PPO | Admitting: Family Medicine

## 2019-12-12 ENCOUNTER — Other Ambulatory Visit: Payer: Self-pay

## 2019-12-12 ENCOUNTER — Encounter: Payer: Self-pay | Admitting: Family Medicine

## 2019-12-12 VITALS — Ht 66.0 in | Wt 163.0 lb

## 2019-12-12 DIAGNOSIS — L649 Androgenic alopecia, unspecified: Secondary | ICD-10-CM

## 2019-12-12 DIAGNOSIS — R200 Anesthesia of skin: Secondary | ICD-10-CM

## 2019-12-12 NOTE — Progress Notes (Signed)
   Patient ID: Kelly Henson, female    DOB: Nov 15, 1965, 54 y.o.   MRN: [id number]   Chief Complaint  Patient presents with  . Alopecia   Subjective:    HPI  CC-hair loss. Pt noticing receding hair line in front. pt has complaints of hair falling out for about 2 months.  No new meds. Had a decrease in thyroid medication 4 months ago. No facial hair growth or acne. Is menopausal.  Hasn't had a period in 3 yrs.  Has been taking biotin for years.  Sweating at night.  No diarrhea/constipation No course or dry skin. Weight is aroun 160-165 lbs. No increase weight loss or gains. +fatigue. Does dye her hair every 3 months, not new over last few years.  Hasn't tried any otc treatments for hair loss.  Medical History Kelly Henson has a past medical history of Acid reflux, Allergy, Recurrent boils, and Thyroid disease.   Outpatient Encounter Medications as of 12/12/2019  Medication Sig  . BIOTIN PO Take by mouth. 5,000 mg one daily  . cetirizine (ZYRTEC) 10 MG tablet Take 10 mg by mouth daily.  Marland Kitchen levothyroxine (SYNTHROID) 112 MCG tablet Take 1 tablet (112 mcg total) by mouth daily.  Marland Kitchen loratadine (CLARITIN) 10 MG tablet Take 10 mg by mouth daily.  . Multiple Vitamin (MULTIVITAMIN) tablet Take 1 tablet by mouth daily.  Marland Kitchen omeprazole (PRILOSEC) 40 MG capsule Take 1 capsule by mouth once daily   No facility-administered encounter medications on file as of 12/12/2019.     Review of Systems  Constitutional: Negative for chills and fever.  HENT: Negative for congestion, rhinorrhea and sore throat.   Respiratory: Negative for cough, shortness of breath and wheezing.   Cardiovascular: Negative for chest pain and leg swelling.  Gastrointestinal: Negative for abdominal pain, diarrhea, nausea and vomiting.  Endocrine: Negative for cold intolerance, heat intolerance, polydipsia, polyphagia and polyuria.       +hair loss  Genitourinary: Negative for dysuria and frequency.  Musculoskeletal:  Negative for arthralgias and back pain.  Skin: Negative for rash.  Neurological: Negative for dizziness, weakness and headaches.     Vitals Ht 5\' 6"  (1.676 m)   Wt 163 lb (73.9 kg)   BMI 26.31 kg/m   Objective:   Physical Exam  Gen- nad. Skin- Hair line in frontal area receding in M pattern. PE limited since video visit.  Assessment and Plan   1. 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 Bilateral leg numbness - B12 and Folate Panel    F/u with results after labs come back.  Pt in agreement.

## 2019-12-18 ENCOUNTER — Encounter: Payer: Self-pay | Admitting: Family Medicine

## 2019-12-21 DIAGNOSIS — L649 Androgenic alopecia, unspecified: Secondary | ICD-10-CM | POA: Diagnosis not present

## 2019-12-21 DIAGNOSIS — R200 Anesthesia of skin: Secondary | ICD-10-CM | POA: Diagnosis not present

## 2019-12-22 LAB — CBC
Hematocrit: 43.1 % (ref 34.0–46.6)
Hemoglobin: 14.8 g/dL (ref 11.1–15.9)
MCH: 32.4 pg (ref 26.6–33.0)
MCHC: 34.3 g/dL (ref 31.5–35.7)
MCV: 94 fL (ref 79–97)
Platelets: 294 10*3/uL (ref 150–450)
RBC: 4.57 x10E6/uL (ref 3.77–5.28)
RDW: 12.3 % (ref 11.7–15.4)
WBC: 7.4 10*3/uL (ref 3.4–10.8)

## 2019-12-22 LAB — B12 AND FOLATE PANEL
Folate: 17.9 ng/mL (ref >3.0)
Vitamin B-12: 1063 pg/mL (ref 232–1245)

## 2019-12-22 LAB — TESTOSTERONE: Testosterone: 18 ng/dL (ref 4–50)

## 2019-12-23 LAB — PROLACTIN: Prolactin: 8.4 ng/mL (ref 4.8–23.3)

## 2019-12-23 LAB — TESTOSTERONE,FREE AND TOTAL
Testosterone, Free: 1.7 pg/mL (ref 0.0–4.2)
Testosterone: 17 ng/dL (ref 4–50)

## 2019-12-23 LAB — DHEA-SULFATE: DHEA-SO4: 131 ug/dL (ref 41.2–243.7)

## 2019-12-26 ENCOUNTER — Encounter: Payer: Self-pay | Admitting: Family Medicine

## 2019-12-26 ENCOUNTER — Ambulatory Visit: Payer: BC Managed Care – PPO | Admitting: Family Medicine

## 2019-12-26 ENCOUNTER — Other Ambulatory Visit: Payer: Self-pay

## 2019-12-26 VITALS — BP 126/82 | HR 88 | Temp 97.6°F | Ht 66.0 in | Wt 167.0 lb

## 2019-12-26 DIAGNOSIS — R300 Dysuria: Secondary | ICD-10-CM | POA: Diagnosis not present

## 2019-12-26 DIAGNOSIS — L659 Nonscarring hair loss, unspecified: Secondary | ICD-10-CM | POA: Diagnosis not present

## 2019-12-26 DIAGNOSIS — M5416 Radiculopathy, lumbar region: Secondary | ICD-10-CM | POA: Diagnosis not present

## 2019-12-26 LAB — POCT URINALYSIS DIPSTICK
Spec Grav, UA: 1.015 (ref 1.010–1.025)
pH, UA: 6 (ref 5.0–8.0)

## 2019-12-26 MED ORDER — MINOXIDIL FOR WOMEN 2 % EX SOLN: Freq: Two times a day (BID) | CUTANEOUS | 0 refills | Status: DC

## 2019-12-26 MED ORDER — DICLOFENAC SODIUM 75 MG PO TBEC: 75.0000 mg | DELAYED_RELEASE_TABLET | Freq: Two times a day (BID) | ORAL | 0 refills | Status: DC

## 2019-12-26 NOTE — Progress Notes (Signed)
Patient ID: Kelly Henson, female    DOB: 20-Aug-1965, 54 y.o.   MRN: [id number]   Chief Complaint  Patient presents with  . Follow-up    hair loss/leg numbness- discuss recent labs  . Dysuria    since mid last week- getting better   Subjective:    HPI Pt seen for f/u on hair loss, review labs and lumbar pain with numbness in legs.  Noticing some sweating occasionally.  H/o hypothyroidism.  Taking levothyroxine daily.  No excessive hair growth on face. Small amt of acne on chin from mask. Not tried minoxidil.  Waking up in morning and roll out of bed due to back pian.  On rt lower back.  Rt leg numbness and occ it's both legs.  achiness in both legs from the knee down.  No trauma or injury to back or legs. Noticing it in sitting down. 800mg  ibuprofen 2x per day.  Going on for 3-4 months. Not having pain in back today.   Results for orders placed or performed in visit on 12/26/19  POCT urinalysis dipstick  Result Value Ref Range   Color, UA     Clarity, UA     Glucose, UA     Bilirubin, UA     Ketones, UA     Spec Grav, UA 1.015 1.010 - 1.025   Blood, UA     pH, UA 6.0 5.0 - 8.0   Protein, UA     Urobilinogen, UA     Nitrite, UA     Leukocytes, UA     Appearance     Odor      Medical History Amillya has a past medical history of Acid reflux, Allergy, Recurrent boils, and Thyroid disease.   Outpatient Encounter Medications as of 12/26/2019  Medication Sig  . BIOTIN PO Take by mouth. 5,000 mg one daily  . cetirizine (ZYRTEC) 10 MG tablet Take 10 mg by mouth daily.  . diclofenac (VOLTAREN) 75 MG EC tablet Take 1 tablet (75 mg total) by mouth 2 (two) times daily.  12/28/2019 levothyroxine (SYNTHROID) 112 MCG tablet Take 1 tablet (112 mcg total) by mouth daily.  Marland Kitchen loratadine (CLARITIN) 10 MG tablet Take 10 mg by mouth daily.  . minoxidil (MINOXIDIL FOR WOMEN) 2 % external solution Apply topically 2 (two) times daily.  . Multiple Vitamin (MULTIVITAMIN) tablet  Take 1 tablet by mouth daily.  Marland Kitchen omeprazole (PRILOSEC) 40 MG capsule Take 1 capsule by mouth once daily   No facility-administered encounter medications on file as of 12/26/2019.     Review of Systems  Constitutional: Negative for chills and fever.  HENT: Negative for congestion, rhinorrhea and sore throat.   Respiratory: Negative for cough, shortness of breath and wheezing.   Cardiovascular: Negative for chest pain and leg swelling.  Gastrointestinal: Negative for abdominal pain, diarrhea, nausea and vomiting.  Endocrine:       +hair loss  Genitourinary: Negative for dysuria and frequency.  Musculoskeletal: Positive for back pain. Negative for arthralgias.  Skin: Negative for rash.  Neurological: Positive for numbness (intermittent leg numbness). Negative for dizziness, weakness and headaches.     Vitals BP 126/82   Pulse 88   Temp 97.6 F (36.4 C) (Oral)   Ht 5\' 6"  (1.676 m)   Wt 167 lb (75.8 kg)   SpO2 97%   BMI 26.95 kg/m   Objective:   Physical Exam Vitals and nursing note reviewed.  Constitutional:      Appearance: Normal  appearance.  HENT:     Head: Normocephalic and atraumatic.     Comments: +M- hairline and some receding hairline.    Nose: Nose normal.     Mouth/Throat:     Mouth: Mucous membranes are moist.     Pharynx: Oropharynx is clear.  Eyes:     Extraocular Movements: Extraocular movements intact.     Conjunctiva/sclera: Conjunctivae normal.     Pupils: Pupils are equal, round, and reactive to light.  Cardiovascular:     Rate and Rhythm: Normal rate and regular rhythm.     Pulses: Normal pulses.     Heart sounds: Normal heart sounds.  Pulmonary:     Effort: Pulmonary effort is normal.     Breath sounds: Normal breath sounds. No wheezing, rhonchi or rales.  Musculoskeletal:        General: Normal range of motion.     Right lower leg: No edema.     Left lower leg: No edema.     Comments: No spinous process tenderness in lumbar.  No ttp over  paraspinal lumbar area. Normal rom of the back, hip, knees. Neg slr bilaterally.  Skin:    General: Skin is warm and dry.     Findings: No lesion or rash.  Neurological:     General: No focal deficit present.     Mental Status: She is alert and oriented to person, place, and time.  Psychiatric:        Mood and Affect: Mood normal.        Behavior: Behavior normal.     Assessment and Plan   1. Loss of hair - minoxidil (MINOXIDIL FOR WOMEN) 2 % external solution; Apply topically 2 (two) times daily.  Dispense: 60 mL; Refill: 0  2. Dysuria - POCT urinalysis dipstick  3. Lumbar back pain with radiculopathy affecting right lower extremity - DG Lumbar Spine Complete; Future - diclofenac (VOLTAREN) 75 MG EC tablet; Take 1 tablet (75 mg total) by mouth 2 (two) times daily.  Dispense: 60 tablet; Refill: 0    Neg- UA. Lumbar radiculopathy- will order xray.  Diclofenac bid. Heat/ice prn.  Loss of hair- labs normal. Advising trial of minoxidil, if not improving then referral to derm.  F/u 5mo or prn.

## 2020-01-24 ENCOUNTER — Other Ambulatory Visit: Payer: Self-pay | Admitting: Family Medicine

## 2020-01-24 DIAGNOSIS — R350 Frequency of micturition: Secondary | ICD-10-CM

## 2020-01-24 NOTE — Telephone Encounter (Signed)
Last med check up 05/18/19. Last labs tstosterone, b12, cbc, dhea- sulfate and prolactin on 12/21/19

## 2020-02-26 ENCOUNTER — Other Ambulatory Visit: Payer: Self-pay | Admitting: Nurse Practitioner

## 2020-02-26 DIAGNOSIS — E039 Hypothyroidism, unspecified: Secondary | ICD-10-CM

## 2020-03-09 NOTE — Addendum Note (Signed)
Addended by: Marlowe Shores on: 03/09/2020 05:23 PM   Modules accepted: Orders

## 2020-03-09 NOTE — Telephone Encounter (Signed)
Lab orders placed and mailed to patient  

## 2020-03-27 DIAGNOSIS — H401132 Primary open-angle glaucoma, bilateral, moderate stage: Secondary | ICD-10-CM | POA: Diagnosis not present

## 2020-03-28 DIAGNOSIS — E039 Hypothyroidism, unspecified: Secondary | ICD-10-CM | POA: Diagnosis not present

## 2020-03-29 ENCOUNTER — Telehealth: Payer: Self-pay | Admitting: Family Medicine

## 2020-03-29 LAB — TSH: TSH: 4.75 u[IU]/mL — ABNORMAL HIGH (ref 0.450–4.500)

## 2020-03-29 NOTE — Telephone Encounter (Signed)
Tsh is slightly elevated.  At 4.7 and normal is under 4.5.   Will continue with this dose and at 112 mcg synthroid and recheck on next visit.  Make sure it's before eating on empty stomach for medication.  F/u In 6 months.  Dr. Ladona Ridgel

## 2020-03-29 NOTE — Telephone Encounter (Signed)
Left message to return call 

## 2020-03-29 NOTE — Telephone Encounter (Signed)
Patient notified and verbalized understanding. 

## 2020-03-29 NOTE — Telephone Encounter (Signed)
Pt calling checking on lab work results.

## 2020-03-30 ENCOUNTER — Other Ambulatory Visit: Payer: Self-pay | Admitting: Nurse Practitioner

## 2020-03-30 DIAGNOSIS — E039 Hypothyroidism, unspecified: Secondary | ICD-10-CM

## 2020-03-30 MED ORDER — LEVOTHYROXINE SODIUM 125 MCG PO TABS: ORAL_TABLET | ORAL | 2 refills | Status: DC

## 2020-04-22 ENCOUNTER — Other Ambulatory Visit: Payer: Self-pay | Admitting: Family Medicine

## 2020-04-22 DIAGNOSIS — R350 Frequency of micturition: Secondary | ICD-10-CM

## 2020-04-30 ENCOUNTER — Ambulatory Visit: Payer: BC Managed Care – PPO | Admitting: Family Medicine

## 2020-04-30 ENCOUNTER — Encounter: Payer: Self-pay | Admitting: Family Medicine

## 2020-04-30 VITALS — BP 114/76 | HR 80 | Temp 97.7°F | Ht 66.0 in | Wt 175.0 lb

## 2020-04-30 DIAGNOSIS — N3000 Acute cystitis without hematuria: Secondary | ICD-10-CM

## 2020-04-30 DIAGNOSIS — E039 Hypothyroidism, unspecified: Secondary | ICD-10-CM

## 2020-04-30 DIAGNOSIS — R300 Dysuria: Secondary | ICD-10-CM | POA: Diagnosis not present

## 2020-04-30 LAB — POCT URINALYSIS DIPSTICK
Spec Grav, UA: >=1.03 — AB (ref 1.010–1.025)
pH, UA: 5 (ref 5.0–8.0)

## 2020-04-30 MED ORDER — CEPHALEXIN 500 MG PO CAPS: 500.0000 mg | ORAL_CAPSULE | Freq: Two times a day (BID) | ORAL | 0 refills | Status: DC

## 2020-04-30 NOTE — Progress Notes (Signed)
Patient ID: Kelly Henson, female    DOB: 20-Nov-1965, 54 y.o.   MRN: [id number]   Chief Complaint  Patient presents with  . Urinary Tract Infection   Subjective:    HPI  CC-low back pain on both sides for 2 -3 days and frequent urination.  Tried cranberry juice yesterday.   Helped some to relieve discomfort. Not burning to urinate.  Has been frequent. H/o uti a few times per year. No other otc meds. No f, n/v/d No trauma or injury to the back. No saddle anesthesia, or bowel or bladder incontinence.  Results for orders placed or performed in visit on 04/30/20  POCT urinalysis dipstick  Result Value Ref Range   Color, UA     Clarity, UA     Glucose, UA     Bilirubin, UA     Ketones, UA     Spec Grav, UA >=1.030 (A) 1.010 - 1.025   Blood, UA     pH, UA 5.0 5.0 - 8.0   Protein, UA     Urobilinogen, UA     Nitrite, UA     Leukocytes, UA     Appearance     Odor       Medical History Kelly Henson has a past medical history of Acid reflux, Allergy, Recurrent boils, and Thyroid disease.   Outpatient Encounter Medications as of 04/30/2020  Medication Sig  . BIOTIN PO Take by mouth. 5,000 mg one daily  . cetirizine (ZYRTEC) 10 MG tablet Take 10 mg by mouth daily.  Marland Kitchen levothyroxine (EUTHYROX) 125 MCG tablet Take one tab in the am Monday through Friday  . loratadine (CLARITIN) 10 MG tablet Take 10 mg by mouth daily.  . Multiple Vitamin (MULTIVITAMIN) tablet Take 1 tablet by mouth daily.  Marland Kitchen omeprazole (PRILOSEC) 40 MG capsule Take 1 capsule by mouth once daily  . [DISCONTINUED] diclofenac (VOLTAREN) 75 MG EC tablet Take 1 tablet (75 mg total) by mouth 2 (two) times daily.  . cephALEXin (KEFLEX) 500 MG capsule Take 1 capsule (500 mg total) by mouth 2 (two) times daily.  . [DISCONTINUED] minoxidil (MINOXIDIL FOR WOMEN) 2 % external solution Apply topically 2 (two) times daily.   No facility-administered encounter medications on file as of 04/30/2020.     Review of Systems   Constitutional: Negative for chills and fever.  HENT: Negative for congestion, rhinorrhea and sore throat.   Respiratory: Negative for cough, shortness of breath and wheezing.   Cardiovascular: Negative for chest pain and leg swelling.  Gastrointestinal: Negative for abdominal pain, diarrhea, nausea and vomiting.  Genitourinary: Positive for frequency. Negative for difficulty urinating, dysuria, hematuria, pelvic pain, vaginal discharge and vaginal pain.  Musculoskeletal: Positive for back pain (lower). Negative for arthralgias.  Skin: Negative for rash.  Neurological: Negative for dizziness, weakness and headaches.     Vitals BP 114/76   Pulse 80   Temp 97.7 F (36.5 C)   Ht 5\' 6"  (1.676 m)   Wt 175 lb (79.4 kg)   SpO2 98%   BMI 28.25 kg/m   Objective:   Physical Exam Vitals and nursing note reviewed.  Constitutional:      Appearance: Normal appearance.  HENT:     Head: Normocephalic and atraumatic.     Mouth/Throat:     Pharynx: Oropharynx is clear.  Eyes:     Extraocular Movements: Extraocular movements intact.     Conjunctiva/sclera: Conjunctivae normal.     Pupils: Pupils are equal, round, and reactive to  light.  Cardiovascular:     Rate and Rhythm: Normal rate and regular rhythm.     Pulses: Normal pulses.     Heart sounds: Normal heart sounds.  Pulmonary:     Effort: Pulmonary effort is normal.     Breath sounds: Normal breath sounds. No wheezing, rhonchi or rales.  Abdominal:     General: Abdomen is flat. Bowel sounds are normal. There is no distension.     Palpations: Abdomen is soft. There is no mass.     Tenderness: There is no abdominal tenderness. There is no right CVA tenderness, left CVA tenderness, guarding or rebound.     Hernia: No hernia is present.     Comments: No suprapubic tenderness  Musculoskeletal:        General: Tenderness (lower back) present. Normal range of motion.     Right lower leg: No edema.     Left lower leg: No edema.      Comments: No ttp over spinous process over T or L spine.  Bilateral Lumbar paraspinal pain on palp. Normal rom. Neg slr bilaterally  Skin:    General: Skin is warm and dry.     Findings: No lesion or rash.  Neurological:     General: No focal deficit present.     Mental Status: She is alert and oriented to person, place, and time.  Psychiatric:        Mood and Affect: Mood normal.        Behavior: Behavior normal.      Assessment and Plan   1. Acute cystitis without hematuria - POCT urinalysis dipstick - cephALEXin (KEFLEX) 500 MG capsule; Take 1 capsule (500 mg total) by mouth 2 (two) times daily.  Dispense: 14 capsule; Refill: 0 - Urinalysis, microscopic only - Urine Culture  2. Hypothyroidism, unspecified type - TSH; Future   Hypothyroid- elevated tsh on last set labs Pt had to let me know accidentally took the medication daily , instead of M-F, so we will push out the recheck on tsh till 4 wks from now since supposed to take med only 5x per week.  Ua- no blood or nitrites on UA. Micro- many bacteria, small wbc, no rbc. Will treat for UTI. Keflex given for 7 days. And ordered urine culture.  F/u 4 wks.

## 2020-05-02 LAB — URINE CULTURE

## 2020-05-07 ENCOUNTER — Telehealth: Payer: Self-pay

## 2020-05-07 NOTE — Telephone Encounter (Signed)
William returning [place] phone call   Pt call back [phone]

## 2020-05-07 NOTE — Telephone Encounter (Signed)
See result note.  

## 2020-05-09 DIAGNOSIS — H401132 Primary open-angle glaucoma, bilateral, moderate stage: Secondary | ICD-10-CM | POA: Diagnosis not present

## 2020-05-28 ENCOUNTER — Ambulatory Visit: Payer: BC Managed Care – PPO | Admitting: Family Medicine

## 2020-06-04 ENCOUNTER — Other Ambulatory Visit: Payer: Self-pay | Admitting: *Deleted

## 2020-06-04 MED ORDER — LEVOTHYROXINE SODIUM 125 MCG PO TABS
ORAL_TABLET | ORAL | 2 refills | Status: DC
Start: 2020-06-04 — End: 2020-07-02

## 2020-06-11 ENCOUNTER — Ambulatory Visit: Payer: BC Managed Care – PPO | Admitting: Family Medicine

## 2020-06-11 ENCOUNTER — Other Ambulatory Visit: Payer: Self-pay

## 2020-07-01 ENCOUNTER — Other Ambulatory Visit: Payer: Self-pay | Admitting: Nurse Practitioner

## 2020-07-27 ENCOUNTER — Other Ambulatory Visit: Payer: Self-pay | Admitting: *Deleted

## 2020-07-27 DIAGNOSIS — R350 Frequency of micturition: Secondary | ICD-10-CM

## 2020-07-27 MED ORDER — OMEPRAZOLE 40 MG PO CPDR: 40.0000 mg | DELAYED_RELEASE_CAPSULE | Freq: Every day | ORAL | 0 refills | Status: DC

## 2020-08-20 DIAGNOSIS — E039 Hypothyroidism, unspecified: Secondary | ICD-10-CM | POA: Diagnosis not present

## 2020-08-21 ENCOUNTER — Telehealth: Payer: Self-pay | Admitting: Family Medicine

## 2020-08-21 ENCOUNTER — Other Ambulatory Visit: Payer: Self-pay | Admitting: *Deleted

## 2020-08-21 DIAGNOSIS — E039 Hypothyroidism, unspecified: Secondary | ICD-10-CM

## 2020-08-21 LAB — TSH: TSH: 13.1 u[IU]/mL — ABNORMAL HIGH (ref 0.450–4.500)

## 2020-08-21 MED ORDER — LEVOTHYROXINE SODIUM 125 MCG PO TABS
ORAL_TABLET | ORAL | 1 refills | Status: DC
Start: 2020-08-21 — End: 2020-11-20

## 2020-08-21 NOTE — Telephone Encounter (Signed)
Pt states she is taking as directed one tablet mon - fri and none on weekends. Has not missed any prescribed doses.

## 2020-08-21 NOTE — Telephone Encounter (Signed)
The tsh is very high at 13.  Is she taking the medication as directed?  Missed any doses?  Thx.   Dr. Ladona Ridgel

## 2020-08-21 NOTE — Telephone Encounter (Signed)
Patient is wanting results of synthroid test.

## 2020-08-21 NOTE — Telephone Encounter (Signed)
Discussed with pt. Pt verbalized understanding.  °

## 2020-08-21 NOTE — Telephone Encounter (Signed)
Tried to call no answer

## 2020-10-31 ENCOUNTER — Other Ambulatory Visit: Payer: Self-pay | Admitting: Family Medicine

## 2020-10-31 DIAGNOSIS — R350 Frequency of micturition: Secondary | ICD-10-CM

## 2020-11-18 ENCOUNTER — Other Ambulatory Visit: Payer: Self-pay | Admitting: Family Medicine

## 2020-11-20 NOTE — Telephone Encounter (Signed)
Lab Results  Component Value Date   TSH 13.100 (H) 08/20/2020

## 2020-11-20 NOTE — Telephone Encounter (Signed)
Attempted to contact patient but voicemail not set up. Will mail labs to patient.

## 2020-11-23 NOTE — Telephone Encounter (Signed)
Voicemail not set up unable to leave message  

## 2020-11-26 ENCOUNTER — Encounter: Payer: Self-pay | Admitting: Family Medicine

## 2020-11-26 NOTE — Telephone Encounter (Signed)
Letter mailed to patient.

## 2020-11-26 NOTE — Progress Notes (Signed)
Dear Kelly Henson,  We have received a refill request for your thyroid medication. We were unsuccessful in reaching you via phone.  Per Dr.Taylor Pt needs to recheck TSH, was supposed to get it a while ago.  To get refills.   Pls have her get this.      Thx.   The labs are in the computer and you may go to [facility] to have this done. Please let us know if you need anything. Have a great day!

## 2020-11-26 NOTE — Telephone Encounter (Signed)
Unable to reach patient; may we close message. Please advise. Thank you

## 2020-11-30 DIAGNOSIS — E039 Hypothyroidism, unspecified: Secondary | ICD-10-CM | POA: Diagnosis not present

## 2020-12-01 LAB — TSH: TSH: 2.64 u[IU]/mL (ref 0.450–4.500)

## 2020-12-01 LAB — TSH+FREE T4
Free T4: 1.32 ng/dL (ref 0.82–1.77)
TSH: 2.4 u[IU]/mL (ref 0.450–4.500)

## 2020-12-02 ENCOUNTER — Other Ambulatory Visit: Payer: Self-pay | Admitting: Family Medicine

## 2020-12-02 DIAGNOSIS — R350 Frequency of micturition: Secondary | ICD-10-CM

## 2020-12-02 MED ORDER — LEVOTHYROXINE SODIUM 125 MCG PO TABS: 125.0000 ug | ORAL_TABLET | Freq: Every day | ORAL | 3 refills | Status: DC

## 2020-12-02 NOTE — Addendum Note (Signed)
Addended by: Annalee Genta on: 12/02/2020 10:50 AM   Modules accepted: Orders

## 2020-12-04 ENCOUNTER — Telehealth: Payer: Self-pay

## 2020-12-04 NOTE — Telephone Encounter (Signed)
Sent mychart message

## 2020-12-04 NOTE — Telephone Encounter (Signed)
Please contact patient to have her set up appt. Then may route back to nurses. Thank you 

## 2020-12-04 NOTE — Telephone Encounter (Signed)
Pt called to scheduled Med Check she does not get off till 3:30 she is wanting to do a virtual can this be schedule?   Pt call [phone]

## 2020-12-04 NOTE — Telephone Encounter (Signed)
What meds is it?  Thyroid and gerd meds?   If so that is fine to do a phone visit.  Dr. Karie Schwalbe

## 2020-12-05 NOTE — Telephone Encounter (Signed)
Ok phone visit is fine. Dr. Karie Schwalbe

## 2020-12-05 NOTE — Telephone Encounter (Signed)
It was sent up in the Med list to make appt for Omeprazole 40MG 

## 2020-12-26 NOTE — Telephone Encounter (Signed)
Called twice to schedule appointment has not called back and sent two my chart message no response

## 2021-01-21 DIAGNOSIS — H409 Unspecified glaucoma: Secondary | ICD-10-CM | POA: Diagnosis not present

## 2021-01-21 DIAGNOSIS — K219 Gastro-esophageal reflux disease without esophagitis: Secondary | ICD-10-CM | POA: Diagnosis not present

## 2021-01-21 DIAGNOSIS — F17200 Nicotine dependence, unspecified, uncomplicated: Secondary | ICD-10-CM | POA: Diagnosis not present

## 2021-01-21 DIAGNOSIS — E039 Hypothyroidism, unspecified: Secondary | ICD-10-CM | POA: Diagnosis not present

## 2021-02-25 DIAGNOSIS — F17200 Nicotine dependence, unspecified, uncomplicated: Secondary | ICD-10-CM | POA: Diagnosis not present

## 2021-02-25 DIAGNOSIS — U099 Post covid-19 condition, unspecified: Secondary | ICD-10-CM | POA: Diagnosis not present

## 2021-02-25 DIAGNOSIS — R053 Chronic cough: Secondary | ICD-10-CM | POA: Diagnosis not present

## 2021-02-25 DIAGNOSIS — K219 Gastro-esophageal reflux disease without esophagitis: Secondary | ICD-10-CM | POA: Diagnosis not present

## 2021-03-25 DIAGNOSIS — H401132 Primary open-angle glaucoma, bilateral, moderate stage: Secondary | ICD-10-CM | POA: Diagnosis not present

## 2021-04-15 DIAGNOSIS — H401132 Primary open-angle glaucoma, bilateral, moderate stage: Secondary | ICD-10-CM | POA: Diagnosis not present

## 2021-04-18 DIAGNOSIS — R350 Frequency of micturition: Secondary | ICD-10-CM | POA: Diagnosis not present

## 2021-04-29 DIAGNOSIS — H401122 Primary open-angle glaucoma, left eye, moderate stage: Secondary | ICD-10-CM | POA: Diagnosis not present

## 2021-05-24 ENCOUNTER — Ambulatory Visit: Payer: Self-pay | Admitting: Nurse Practitioner

## 2022-01-02 ENCOUNTER — Encounter (INDEPENDENT_AMBULATORY_CARE_PROVIDER_SITE_OTHER): Payer: Self-pay | Admitting: *Deleted

## 2022-03-03 ENCOUNTER — Telehealth (INDEPENDENT_AMBULATORY_CARE_PROVIDER_SITE_OTHER): Payer: Self-pay

## 2022-03-03 ENCOUNTER — Encounter (INDEPENDENT_AMBULATORY_CARE_PROVIDER_SITE_OTHER): Payer: Self-pay

## 2022-03-03 ENCOUNTER — Other Ambulatory Visit (INDEPENDENT_AMBULATORY_CARE_PROVIDER_SITE_OTHER): Payer: Self-pay

## 2022-03-03 ENCOUNTER — Ambulatory Visit (INDEPENDENT_AMBULATORY_CARE_PROVIDER_SITE_OTHER): Payer: BC Managed Care – PPO | Admitting: Gastroenterology

## 2022-03-03 ENCOUNTER — Encounter (INDEPENDENT_AMBULATORY_CARE_PROVIDER_SITE_OTHER): Payer: Self-pay | Admitting: Gastroenterology

## 2022-03-03 VITALS — BP 110/78 | HR 92 | Temp 99.1°F | Ht 66.0 in | Wt 175.0 lb

## 2022-03-03 DIAGNOSIS — R1310 Dysphagia, unspecified: Secondary | ICD-10-CM

## 2022-03-03 DIAGNOSIS — K219 Gastro-esophageal reflux disease without esophagitis: Secondary | ICD-10-CM | POA: Insufficient documentation

## 2022-03-03 DIAGNOSIS — Z1211 Encounter for screening for malignant neoplasm of colon: Secondary | ICD-10-CM

## 2022-03-03 MED ORDER — PEG 3350-KCL-NA BICARB-NACL 420 G PO SOLR: 4000.0000 mL | ORAL | 0 refills | Status: DC

## 2022-03-03 NOTE — Patient Instructions (Signed)
It was nice to meet you! We will get you scheduled for EGD for you swallowing and initial colonoscopy for colon cancer screening Please increase omeprazole to twice a day, just take morning dose 2 hours before or 4 hours after your thyroid medication  Avoid greasy, spicy, fried, citrus foods, and be mindful that caffeine, carbonated drinks, chocolate and alcohol can increase reflux symptoms Stay upright 2-3 hours after eating, prior to lying down and avoid eating late in the evenings.  Follow up 3 months

## 2022-03-03 NOTE — Telephone Encounter (Signed)
Gianlucca Szymborski Ann Yaslene Lindamood, CMA  ?

## 2022-03-03 NOTE — Progress Notes (Signed)
Referring Provider: Antonietta Henson Primary Care Physician:  Kelly Henson Primary GI Physician: never  Chief Complaint  Patient presents with   Gastroesophageal Reflux    New patient. Referred for GERD and dysphagia. Takes omeprazole 40mg  daily and has some breakthrough symptoms.    HPI:   Kelly Henson is a 56 y.o. female with past medical history of acid reflux, thyroid.   Patient presenting today as a new patient for GERD/dysphagia.   Patient reports hx of GERD, states for the 4-6 months she has had more dysphagia, occurring a few times per week. Often occurs with smaller pills and food but she is unsure of specific foods. Feels that food sticks at sternal notch. She can usually drink liquid and get food or pill to pass. Denies odynophagia. Denies nausea or vomiting. She has some LUQ pain 2-3x/week. Unsure of alleviating or precipitating factors, does not take anything for this. She states she is supposed to be on omeprazole 40mg  BID, taking it only once a day due to her thyroid medicine, she notices heartburn 2-3x/week. Previously taking PPI in the morning but was waking up at night with burning in her throat so now taking PPI around 1730 in the evenings. Has occasional sore throat cough and hoarseness but she dose smoke. Denies rectal bleeding or melena. No constipation or diarrhea.   NSAID use: ibuprofen 2-3x/week, usually 800mg  at a time, has been taking this for a long time Social hx: no etoh, 1ppd  Fam hx:no crc or liver disease, no pancreatic cancer   Last Colonoscopy: never Last Endoscopy: never  Recommendations:    Past Medical History:  Diagnosis Date   Acid reflux    Allergy    Recurrent boils    Thyroid disease    Wears dentures    full set on top and partial on bottom    Past Surgical History:  Procedure Laterality Date   cyst removed     DILATION AND CURETTAGE OF UTERUS     TUBAL LIGATION      Current Outpatient Medications   Medication Sig Dispense Refill   BIOTIN PO Take by mouth. 5,000 mg one daily     levothyroxine (SYNTHROID) 112 MCG tablet Take 112 mcg by mouth daily before breakfast.     loratadine (CLARITIN) 10 MG tablet Take 10 mg by mouth daily.     Multiple Vitamin (MULTIVITAMIN) tablet Take 1 tablet by mouth daily.     omeprazole (PRILOSEC) 40 MG capsule TAKE 1 CAPSULE BY MOUTH ONCE DAILY --  NEEDS  OFFICE  VISIT 30 capsule 0   No current facility-administered medications for this visit.    Allergies as of 03/03/2022   (No Known Allergies)    Family History  Problem Relation Age of Onset   Diabetes Mother    Hypertension Mother    Hypertension Father    Cancer Father        penial   Diabetes Sister     Social History   Socioeconomic History   Marital status: Married    Spouse name: Not on file   Number of children: Not on file   Years of education: Not on file   Highest education level: Not on file  Occupational History   Not on file  Tobacco Use   Smoking status: Every Day    Packs/day: 2.00    Years: 29.00    Total pack years: 58.00    Types: Cigarettes    Passive  exposure: Current   Smokeless tobacco: Never  Substance and Sexual Activity   Alcohol use: No   Drug use: No   Sexual activity: Yes    Birth control/protection: Surgical  Other Topics Concern   Not on file  Social History Narrative   Not on file   Social Determinants of Health   Financial Resource Strain: Not on file  Food Insecurity: Not on file  Transportation Needs: Not on file  Physical Activity: Not on file  Stress: Not on file  Social Connections: Not on file   Review of systems General: negative for malaise, night sweats, fever, chills, weight los Neck: Negative for lumps, goiter, pain and significant neck swelling Resp: Negative for cough, wheezing, dyspnea at rest CV: Negative for chest pain, leg swelling, palpitations, orthopnea GI: denies melena, hematochezia, nausea, vomiting,  diarrhea, constipation, odyonophagia, early satiety or unintentional weight loss. +dysphagia MSK: Negative for joint pain or swelling, back pain, and muscle pain. Derm: Negative for itching or rash Psych: Denies depression, anxiety, memory loss, confusion. No homicidal or suicidal ideation.  Heme: Negative for prolonged bleeding, bruising easily, and swollen nodes. Endocrine: Negative for cold or heat intolerance, polyuria, polydipsia and goiter. Neuro: negative for tremor, gait imbalance, syncope and seizures. The remainder of the review of systems is noncontributory.  Physical Exam: BP 110/78 (BP Location: Left Arm, Patient Position: Sitting, Cuff Size: Large)   Pulse 92   Temp 99.1 F (37.3 C) (Oral)   Ht 5\' 6"  (1.676 m)   Wt 175 lb (79.4 kg)   BMI 28.25 kg/m  General:   Alert and oriented. No distress noted. Pleasant and cooperative.  Head:  Normocephalic and atraumatic. Eyes:  Conjuctiva clear without scleral icterus. Mouth:  Oral mucosa pink and moist. Good dentition. No lesions. Heart: Normal rate and rhythm, s1 and s2 heart sounds present.  Lungs: Clear lung sounds in all lobes. Respirations equal and unlabored. Abdomen:  +BS, soft, non-tender and non-distended. No rebound or guarding. No HSM or masses noted. Derm: No palmar erythema or jaundice Msk:  Symmetrical without gross deformities. Normal posture. Extremities:  Without edema. Neurologic:  Alert and  oriented x4 Psych:  Alert and cooperative. Normal mood and affect.  Invalid input(s): "6 MONTHS"   ASSESSMENT: Kelly Henson is a 56 y.o. female presenting today as a new patient for dysphagia.  History of GERD with more recent dysphagia with solid foods and pills over the past 4-6  months, currently on omeprazole 40mg  daily, supposed to be on BID dosing but states since she takes synthroid in the morning and was waking up with burning in her throat at night she decided to keep with omeprazole dosing in the  evenings around 1730. Notices breakthrough GERD symptoms 2-3x/week, usually mid day. We discussed that she can take morning dose of omeprazole about 4 hours after her dose of synthroid, especially given that she takes synthroid at 3am. Also recommended EGD +/- dilation for her ongoing dysphagia as we cannot rule out esophageal ring, web, stricture, stenosis or less likely, malignancy.  No previous colonoscopy, no family history of CRC. She Is interested in schedule CRC screening, will proceed with this at time of EGD.  Indications, risks and benefits of procedure discussed in detail with patient. Patient verbalized understanding and is in agreement to proceed with EGD/Colonoscopy at this time.    PLAN:  EGD +/- DILATION and colonoscopy-ASA II 2. Increase omeprazole 40mg  to BID 3.  Reflux precautions   All questions were answered,  patient verbalized understanding and is in agreement with plan as outlined above.    Follow Up: 3 months   Kelly Henson L. Jeanmarie Hubert, MSN, APRN, AGNP-C Adult-Gerontology Nurse Practitioner [facility]

## 2022-04-08 ENCOUNTER — Encounter (HOSPITAL_COMMUNITY): Payer: BC Managed Care – PPO

## 2022-04-11 ENCOUNTER — Ambulatory Visit (HOSPITAL_COMMUNITY): Admit: 2022-04-11 | Payer: BC Managed Care – PPO | Admitting: Gastroenterology

## 2022-04-11 ENCOUNTER — Encounter (HOSPITAL_COMMUNITY): Payer: Self-pay

## 2022-04-11 SURGERY — COLONOSCOPY WITH PROPOFOL
Anesthesia: Monitor Anesthesia Care

## 2022-05-17 ENCOUNTER — Encounter (INDEPENDENT_AMBULATORY_CARE_PROVIDER_SITE_OTHER): Payer: Self-pay | Admitting: Gastroenterology

## 2022-05-19 ENCOUNTER — Ambulatory Visit (INDEPENDENT_AMBULATORY_CARE_PROVIDER_SITE_OTHER): Payer: BC Managed Care – PPO | Admitting: Gastroenterology

## 2024-02-01 ENCOUNTER — Encounter: Payer: Self-pay | Admitting: Neurology

## 2024-02-01 ENCOUNTER — Other Ambulatory Visit: Payer: Self-pay

## 2024-02-01 DIAGNOSIS — R202 Paresthesia of skin: Secondary | ICD-10-CM

## 2024-02-16 ENCOUNTER — Ambulatory Visit: Attending: Internal Medicine | Admitting: Internal Medicine

## 2024-02-16 ENCOUNTER — Encounter: Payer: Self-pay | Admitting: *Deleted

## 2024-02-16 ENCOUNTER — Encounter: Payer: Self-pay | Admitting: Internal Medicine

## 2024-02-16 ENCOUNTER — Telehealth: Payer: Self-pay | Admitting: Internal Medicine

## 2024-02-16 VITALS — BP 120/76 | HR 89 | Ht 64.0 in | Wt 187.2 lb

## 2024-02-16 DIAGNOSIS — R9439 Abnormal result of other cardiovascular function study: Secondary | ICD-10-CM | POA: Diagnosis not present

## 2024-02-16 DIAGNOSIS — R079 Chest pain, unspecified: Secondary | ICD-10-CM | POA: Diagnosis not present

## 2024-02-16 DIAGNOSIS — Z720 Tobacco use: Secondary | ICD-10-CM | POA: Insufficient documentation

## 2024-02-16 DIAGNOSIS — Z01812 Encounter for preprocedural laboratory examination: Secondary | ICD-10-CM | POA: Diagnosis not present

## 2024-02-16 DIAGNOSIS — I739 Peripheral vascular disease, unspecified: Secondary | ICD-10-CM | POA: Insufficient documentation

## 2024-02-16 DIAGNOSIS — I200 Unstable angina: Secondary | ICD-10-CM | POA: Insufficient documentation

## 2024-02-16 MED ORDER — NITROGLYCERIN 0.4 MG SL SUBL: 0.4000 mg | SUBLINGUAL_TABLET | SUBLINGUAL | 3 refills | Status: AC | PRN

## 2024-02-16 MED ORDER — ASPIRIN 81 MG PO TBEC: 81.0000 mg | DELAYED_RELEASE_TABLET | Freq: Every day | ORAL | Status: AC

## 2024-02-16 MED ORDER — ROSUVASTATIN CALCIUM 20 MG PO TABS: 20.0000 mg | ORAL_TABLET | Freq: Every day | ORAL | 6 refills | Status: AC

## 2024-02-16 MED ORDER — METOPROLOL TARTRATE 25 MG PO TABS: 25.0000 mg | ORAL_TABLET | Freq: Two times a day (BID) | ORAL | 6 refills | Status: AC

## 2024-02-16 NOTE — Telephone Encounter (Signed)
 Checking percert on the following patient for   Left Heart Cath - 8/14 - Patwardhan - 7:30

## 2024-02-16 NOTE — Patient Instructions (Signed)
 Medication Instructions:   Begin SL Nitroglycerin  as needed for severe chest pain  Begin Metoprolol  Tart (Lopressor ) 25mg  twice a day - check BP before & after - if see significant drop decrease to 1/2 tab & notify the office  Begin Aspirin  81mg  daily  Begin Crestor  20mg  at bedtime  Continue all other medications.     Labwork:  BMET, CBC - orders given today   Testing/Procedures:  Your physician has requested that you have an echocardiogram. Echocardiography is a painless test that uses sound waves to create images of your heart. It provides your doctor with information about the size and shape of your heart and how well your heart's chambers and valves are working. This procedure takes approximately one hour. There are no restrictions for this procedure. Please do NOT wear cologne, perfume, aftershave, or lotions (deodorant is allowed). Please arrive 15 minutes prior to your appointment time.  Please note: We ask at that you not bring children with you during ultrasound (echo/ vascular) testing. Due to room size and safety concerns, children are not allowed in the ultrasound rooms during exams. Our front office staff cannot provide observation of children in our lobby area while testing is being conducted. An adult accompanying a patient to their appointment will only be allowed in the ultrasound room at the discretion of the ultrasound technician under special circumstances. We apologize for any inconvenience. Your physician has requested that you have a cardiac catheterization. Cardiac catheterization is used to diagnose and/or treat various heart conditions. Doctors may recommend this procedure for a number of different reasons. The most common reason is to evaluate chest pain. Chest pain can be a symptom of coronary artery disease (CAD), and cardiac catheterization can show whether plaque is narrowing or blocking your heart's arteries. This procedure is also used to evaluate the valves, as  well as measure the blood flow and oxygen levels in different parts of your heart. For further information please visit [email or web address] Please follow instruction sheet, as given.   Follow-Up:  Office will contact with results via phone, letter or mychart.    1 month   Any Other Special Instructions Will Be Listed Below (If Applicable).   If you need a refill on your cardiac medications before your next appointment, please call your pharmacy.

## 2024-02-16 NOTE — Progress Notes (Signed)
 Cardiology Office Note  Date: 02/16/2024   ID: SHERREE SHANKMAN, DOB 1965/07/23, MRN [id number]  PCP:  Vida Mardy DEL, PA-C  Cardiologist:  None Electrophysiologist:  None   History of Present Illness: Kelly Henson is a 58 y.o. female  Referred to cardiology clinic for evaluation of abnormal cardiac stress test.  Records from Care Everywhere reviewed.  ABI in Feb 2025 was within normal limits.  NM stress test in August 2024 showed moderate reversible perfusion defect involving the apical inferior, mid inferior and basal inferior segments.  Less likely attenuation artifact.  Patient reported ongoing chest pain for the last 4 weeks.  Chest pain occurs substernally and radiates to bilateral shoulders, neck and back.  It occurs with any exertional activity.  Occurs daily.  It also started to occur at rest.  She noticed having more frequent episodes recently.  Never woke her up from the sleep.  Unable to tell me the duration of chest pains but it would ease down once she stops what she is doing.  It also resolves with aspirin .  Taking aspirin  81 mg every day.  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  Current smoker, smokes 1 pack/day.  She and her husband are motivated to quit.  I reviewed her ABI and stress test findings with the patient.  Past Medical History:  Diagnosis Date   Acid reflux    Allergy    Recurrent boils    Thyroid  disease    Wears dentures    full set on top and partial on bottom    Past Surgical History:  Procedure Laterality Date   cyst removed     DILATION AND CURETTAGE OF UTERUS     TUBAL LIGATION      Current Outpatient Medications  Medication Sig Dispense Refill   BIOTIN PO Take by mouth. 5,000 mg one daily     levothyroxine  (SYNTHROID ) 112 MCG tablet Take 112 mcg by mouth daily before breakfast.     loratadine (CLARITIN) 10 MG tablet Take 10 mg by mouth daily.     Multiple Vitamin (MULTIVITAMIN) tablet Take 1  tablet by mouth daily.     omeprazole  (PRILOSEC) 40 MG capsule TAKE 1 CAPSULE BY MOUTH ONCE DAILY --  NEEDS  OFFICE  VISIT 30 capsule 0   polyethylene glycol-electrolytes (TRILYTE) 420 g solution Take 4,000 mLs by mouth as directed. 4000 mL 0   No current facility-administered medications for this visit.   Allergies:  Patient has no known allergies.   Social History: The patient  reports that she has been smoking cigarettes. She has a 58 pack-year smoking history. She has been exposed to tobacco smoke. She has never used smokeless tobacco. She reports that she does not drink alcohol and does not use drugs.   Family History: The patient's family history includes Cancer in her father; Diabetes in her mother and sister; Hypertension in her father and mother.   ROS:  Please see the history of present illness. Otherwise, complete review of systems is positive for none  All other systems are reviewed and negative.   Physical Exam: VS:  There were no vitals taken for this visit., BMI There is no height or weight on file to calculate BMI.  Wt Readings from Last 3 Encounters:  03/03/22 175 lb (79.4 kg)  04/30/20 175 lb (79.4 kg)  12/26/19 167 lb (75.8 kg)    General: Patient appears comfortable at rest. HEENT: Conjunctiva and lids  normal, oropharynx clear with moist mucosa. Neck: Supple, no elevated JVP or carotid bruits, no thyromegaly. Lungs: Clear to auscultation, nonlabored breathing at rest. Cardiac: Regular rate and rhythm, no S3 or significant systolic murmur, no pericardial rub. Abdomen: Soft, nontender, no hepatomegaly, bowel sounds present, no guarding or rebound. Extremities: No pitting edema Skin: Warm and dry. Musculoskeletal: No kyphosis. Neuropsychiatric: Alert and oriented x3, affect grossly appropriate.  Recent Labwork: No results found for requested labs within last 365 days.     Component Value Date/Time   CHOL 176 08/22/2019 1146   TRIG 155 (H) 08/22/2019 1146   HDL  63 08/22/2019 1146   CHOLHDL 2.8 08/22/2019 1146   LDLCALC 87 08/22/2019 1146     Assessment and Plan:  Accelerating angina Positive cardiac stress test - Exertional chest pains x 4 weeks, radiating to her back, bilateral shoulders and neck that resolves with rest.  Occurs daily.  Occurs at rest sometimes.  Relieved by aspirin  and rest.  She reported that these chest pain episodes are becoming more frequent. - Stress test at [facility] showed moderate reversible perfusion defect in the entire inferior wall. - Start aspirin  81 mg once daily. - Start rosuvastatin  20 mg nightly. - Start metoprolol  tartrate 25 mg twice daily. - SL NTG 0.4 mg as needed for chest pain. - Obtain echo today cardiogram. - Patient will benefit from invasive ischemic evaluation with LHC due to accelerating angina and positive cardiac stress test.  HLD, not at goal - Start rosuvastatin  20 mg nightly.  Goal LDL less than 70.  Nicotine abuse - Current smoker.  Smokes 1 pack/day.  She and her husband decided to quit smoking completely.  Counseling provided.  Bilateral lower extremity claudication, rule out PAD - Continues to have claudication.  ABI within normal limits.  She probably would benefit from stress ABI versus ultrasound arterial Doppler studies.  Will discuss more about PAD in the next visit after her LHC. - Continue cardioprotective medications, aspirin , high intensity statin and smoking cessation counseling.   I spent 45 minutes reviewing the records, tests, labs, reports, discussion of CAD and abnormal cardiac stress test and documentation.  I answered all her questions.  Medication Adjustments/Labs and Tests Ordered: Current medicines are reviewed at length with the patient today.  Concerns regarding medicines are outlined above.    Disposition:  Follow up 1 month after LHC  Signed Merville Hijazi Arleta Maywood, MD, 02/16/2024 1:22 PM    [facility] at [facility] [address] [place],  [place], [place] [address]

## 2024-02-17 ENCOUNTER — Other Ambulatory Visit (HOSPITAL_COMMUNITY)
Admission: RE | Admit: 2024-02-17 | Discharge: 2024-02-17 | Disposition: A | Discharge status: Home / Self Care | Source: Ambulatory Visit | Attending: Internal Medicine | Admitting: Internal Medicine

## 2024-02-17 DIAGNOSIS — R079 Chest pain, unspecified: Secondary | ICD-10-CM | POA: Insufficient documentation

## 2024-02-17 DIAGNOSIS — Z01812 Encounter for preprocedural laboratory examination: Secondary | ICD-10-CM | POA: Diagnosis present

## 2024-02-17 LAB — BASIC METABOLIC PANEL WITH GFR
Anion gap: 8 (ref 5–15)
BUN: 14 mg/dL (ref 6–20)
CO2: 27 mmol/L (ref 22–32)
Calcium: 9.5 mg/dL (ref 8.9–10.3)
Chloride: 103 mmol/L (ref 98–111)
Creatinine, Ser: 0.8 mg/dL (ref 0.44–1.00)
GFR, Estimated: >60 mL/min (ref >60)
Glucose, Bld: 99 mg/dL (ref 70–99)
Potassium: 5.2 mmol/L — ABNORMAL HIGH (ref 3.5–5.1)
Sodium: 138 mmol/L (ref 135–145)

## 2024-02-17 LAB — CBC
HCT: 47.8 % — ABNORMAL HIGH (ref 36.0–46.0)
Hemoglobin: 16.2 g/dL — ABNORMAL HIGH (ref 12.0–15.0)
MCH: 32.6 pg (ref 26.0–34.0)
MCHC: 33.9 g/dL (ref 30.0–36.0)
MCV: 96.2 fL (ref 80.0–100.0)
Platelets: 327 K/uL (ref 150–400)
RBC: 4.97 MIL/uL (ref 3.87–5.11)
RDW: 13.5 % (ref 11.5–15.5)
WBC: 7.5 K/uL (ref 4.0–10.5)
nRBC: 0 % (ref 0.0–0.2)

## 2024-02-18 ENCOUNTER — Other Ambulatory Visit: Payer: Self-pay

## 2024-02-18 ENCOUNTER — Other Ambulatory Visit (HOSPITAL_COMMUNITY): Payer: Self-pay

## 2024-02-18 ENCOUNTER — Encounter (HOSPITAL_COMMUNITY)
Admission: RE | Disposition: A | Discharge status: Home / Self Care | Payer: Self-pay | Source: Home / Self Care | Attending: Cardiology

## 2024-02-18 ENCOUNTER — Ambulatory Visit (HOSPITAL_COMMUNITY)
Admission: RE | Admit: 2024-02-18 | Discharge: 2024-02-18 | Disposition: A | Discharge status: Home / Self Care | Attending: Cardiology | Admitting: Cardiology

## 2024-02-18 DIAGNOSIS — Z79899 Other long term (current) drug therapy: Secondary | ICD-10-CM | POA: Diagnosis not present

## 2024-02-18 DIAGNOSIS — Z7982 Long term (current) use of aspirin: Secondary | ICD-10-CM | POA: Insufficient documentation

## 2024-02-18 DIAGNOSIS — I2582 Chronic total occlusion of coronary artery: Secondary | ICD-10-CM | POA: Diagnosis not present

## 2024-02-18 DIAGNOSIS — R9439 Abnormal result of other cardiovascular function study: Secondary | ICD-10-CM | POA: Diagnosis present

## 2024-02-18 DIAGNOSIS — E785 Hyperlipidemia, unspecified: Secondary | ICD-10-CM | POA: Insufficient documentation

## 2024-02-18 DIAGNOSIS — F17210 Nicotine dependence, cigarettes, uncomplicated: Secondary | ICD-10-CM | POA: Insufficient documentation

## 2024-02-18 DIAGNOSIS — Z955 Presence of coronary angioplasty implant and graft: Secondary | ICD-10-CM

## 2024-02-18 DIAGNOSIS — R1013 Epigastric pain: Secondary | ICD-10-CM

## 2024-02-18 DIAGNOSIS — I25118 Atherosclerotic heart disease of native coronary artery with other forms of angina pectoris: Secondary | ICD-10-CM

## 2024-02-18 DIAGNOSIS — Z720 Tobacco use: Secondary | ICD-10-CM | POA: Diagnosis present

## 2024-02-18 DIAGNOSIS — I200 Unstable angina: Secondary | ICD-10-CM | POA: Diagnosis present

## 2024-02-18 DIAGNOSIS — I2510 Atherosclerotic heart disease of native coronary artery without angina pectoris: Secondary | ICD-10-CM | POA: Insufficient documentation

## 2024-02-18 HISTORY — PX: CORONARY STENT INTERVENTION: CATH118234

## 2024-02-18 HISTORY — PX: LEFT HEART CATH AND CORONARY ANGIOGRAPHY: CATH118249

## 2024-02-18 HISTORY — PX: CORONARY ULTRASOUND/IVUS: CATH118244

## 2024-02-18 LAB — POCT ACTIVATED CLOTTING TIME
Activated Clotting Time: 331 s
Activated Clotting Time: 245 s
Activated Clotting Time: 406 s
Activated Clotting Time: 170 s
Activated Clotting Time: 187 s

## 2024-02-18 LAB — POTASSIUM: Potassium: 4.6 mmol/L (ref 3.5–5.1)

## 2024-02-18 MED ORDER — ASPIRIN 81 MG PO CHEW: 81.0000 mg | CHEWABLE_TABLET | ORAL | Status: DC

## 2024-02-18 MED ORDER — MIDAZOLAM HCL 2 MG/2ML IJ SOLN
INTRAMUSCULAR | Status: AC
  Filled 2024-02-18: qty 2

## 2024-02-18 MED ORDER — SODIUM CHLORIDE 0.9 % IV SOLN: 250.0000 mL | INTRAVENOUS | Status: DC | PRN

## 2024-02-18 MED ORDER — HYDRALAZINE HCL 20 MG/ML IJ SOLN: 10.0000 mg | INTRAMUSCULAR | Status: AC | PRN

## 2024-02-18 MED ORDER — VERAPAMIL HCL 2.5 MG/ML IV SOLN
INTRAVENOUS | Status: AC
Start: 2024-02-18 — End: 2024-02-18
  Filled 2024-02-18: qty 2

## 2024-02-18 MED ORDER — MIDAZOLAM HCL 2 MG/2ML IJ SOLN
INTRAMUSCULAR | Status: DC | PRN
  Administered 2024-02-18 (×2): 1 mg via INTRAVENOUS

## 2024-02-18 MED ORDER — HEPARIN SODIUM (PORCINE) 1000 UNIT/ML IJ SOLN
INTRAMUSCULAR | Status: AC
  Filled 2024-02-18: qty 10

## 2024-02-18 MED ORDER — PANTOPRAZOLE SODIUM 20 MG PO TBEC
20.0000 mg | DELAYED_RELEASE_TABLET | Freq: Every day | ORAL | 2 refills | Status: AC
  Filled 2024-02-18: qty 90, 90d supply, fill #0

## 2024-02-18 MED ORDER — LABETALOL HCL 5 MG/ML IV SOLN
10.0000 mg | INTRAVENOUS | Status: AC | PRN
Start: 2024-02-18 — End: 2024-02-18

## 2024-02-18 MED ORDER — HEPARIN SODIUM (PORCINE) 1000 UNIT/ML IJ SOLN
INTRAMUSCULAR | Status: DC | PRN
  Administered 2024-02-18: 9000 [IU] via INTRAVENOUS

## 2024-02-18 MED ORDER — SODIUM CHLORIDE 0.9 % IV SOLN
INTRAVENOUS | Status: DC | PRN
  Administered 2024-02-18 (×4): 250 mL via INTRAVENOUS

## 2024-02-18 MED ORDER — VERAPAMIL HCL 2.5 MG/ML IV SOLN
INTRAVENOUS | Status: DC | PRN
  Administered 2024-02-18: 10 mL via INTRA_ARTERIAL

## 2024-02-18 MED ORDER — CLOPIDOGREL BISULFATE 300 MG PO TABS
ORAL_TABLET | ORAL | Status: DC | PRN
Start: 2024-02-18 — End: 2024-02-18
  Administered 2024-02-18: 600 mg via ORAL

## 2024-02-18 MED ORDER — FENTANYL CITRATE (PF) 100 MCG/2ML IJ SOLN
INTRAMUSCULAR | Status: DC | PRN
  Administered 2024-02-18 (×2): 25 ug via INTRAVENOUS

## 2024-02-18 MED ORDER — SODIUM CHLORIDE 0.9% FLUSH: 3.0000 mL | Freq: Two times a day (BID) | INTRAVENOUS | Status: DC

## 2024-02-18 MED ORDER — NITROGLYCERIN 1 MG/10 ML FOR IR/CATH LAB
INTRA_ARTERIAL | Status: DC | PRN
  Administered 2024-02-18 (×2): 200 ug via INTRACORONARY
  Administered 2024-02-18: 200 ug via INTRA_ARTERIAL
  Administered 2024-02-18: 100 ug via INTRACORONARY

## 2024-02-18 MED ORDER — FREE WATER: 500.0000 mL | Freq: Once | Status: DC

## 2024-02-18 MED ORDER — HEPARIN (PORCINE) IN NACL 1000-0.9 UT/500ML-% IV SOLN
INTRAVENOUS | Status: DC | PRN
Start: 2024-02-18 — End: 2024-02-18
  Administered 2024-02-18: 1000 mL

## 2024-02-18 MED ORDER — ACETAMINOPHEN 325 MG PO TABS: 650.0000 mg | ORAL_TABLET | ORAL | Status: DC | PRN

## 2024-02-18 MED ORDER — SODIUM CHLORIDE 0.9 % IV SOLN
250.0000 mL | INTRAVENOUS | Status: DC | PRN
Start: 2024-02-18 — End: 2024-02-18

## 2024-02-18 MED ORDER — LIDOCAINE HCL (PF) 1 % IJ SOLN
INTRAMUSCULAR | Status: DC | PRN
  Administered 2024-02-18: 2 mL
  Administered 2024-02-18: 5 mL

## 2024-02-18 MED ORDER — LIDOCAINE HCL (PF) 1 % IJ SOLN
INTRAMUSCULAR | Status: AC
  Filled 2024-02-18: qty 30

## 2024-02-18 MED ORDER — SODIUM CHLORIDE 0.9% FLUSH: 3.0000 mL | INTRAVENOUS | Status: DC | PRN

## 2024-02-18 MED ORDER — ONDANSETRON HCL 4 MG/2ML IJ SOLN: 4.0000 mg | Freq: Four times a day (QID) | INTRAMUSCULAR | Status: DC | PRN

## 2024-02-18 MED ORDER — FENTANYL CITRATE (PF) 100 MCG/2ML IJ SOLN
INTRAMUSCULAR | Status: AC
  Filled 2024-02-18: qty 2

## 2024-02-18 MED ORDER — CLOPIDOGREL BISULFATE 75 MG PO TABS
75.0000 mg | ORAL_TABLET | Freq: Every day | ORAL | 3 refills | Status: AC
  Filled 2024-02-18: qty 90, 90d supply, fill #0

## 2024-02-18 MED ORDER — IOHEXOL 350 MG/ML SOLN
INTRAVENOUS | Status: DC | PRN
  Administered 2024-02-18: 180 mL

## 2024-02-18 MED ORDER — NITROGLYCERIN 1 MG/10 ML FOR IR/CATH LAB
INTRA_ARTERIAL | Status: DC
Start: 2024-02-18 — End: 2024-02-18
  Filled 2024-02-18: qty 10

## 2024-02-18 MED ORDER — CLOPIDOGREL BISULFATE 75 MG PO TABS: 75.0000 mg | ORAL_TABLET | Freq: Every day | ORAL | Status: DC

## 2024-02-18 MED ORDER — SODIUM CHLORIDE 0.9 % IV SOLN: INTRAVENOUS | Status: AC

## 2024-02-18 NOTE — Discharge Summary (Addendum)
 Discharge Summary for Same Day PCI   Patient ID: Kelly Henson MRN: [id number]; DOB: Nov 08, 1965  Admit date: 02/18/2024 Discharge date: 02/18/2024  Primary Care Provider: Vida Mardy DEL, PA-C  Primary Cardiologist: Diannah SHAUNNA Maywood, MD  Primary Electrophysiologist:  None   Discharge Diagnoses    Principal Problem:   Accelerating angina [facility]) Active Problems:   Positive cardiac stress test   Tobacco abuse   CAD (coronary artery disease)   Diagnostic Studies/Procedures    Cardiac Catheterization 02/18/2024:  Coronary angiography & intervention 02/18/2024: LM: Normal LAD: No significant disease Lcx: Medium caliber vessel         Prox 30% disease         Small to medium caliber OM2 with prox 90% stenosis RCA: Small to medium caliber vessel           Proximal 60% stenosis, followed by 100% occlusion with ambiguous cap           Septal and epicardial left-to-right collaterals, grade 2   LVEDP 11 mmHg   Successful percutaneous coronary intervention OM2        PTCA and stent placement 2.25 X 20 mm Synergy drug-eluting stent        Postdilatation using 2.25 x 12 mm [place] balloon up to 18 atm, and 2.5 x 12 mm [place] balloon up to 24 atm proximally   _____________   History of Present Illness     Kelly Henson is a 58 y.o. female with acid reflux, thyroid  disease, tobacco abuse who presented to the hospital for planned heart cath. She had recently seen Dr. Mallipeddi for chest pain occurring primarily with exertion which had also begun to occur at rest. She had had a stress test under [facility] at [facility] in early August 2025 which was abnormal with moderate in size, moderate in severity, completely reversible defect involving the apical inferior, mid inferior and basal inferior segments felt consistent with ischemia, EF 65%. Note also outlines prior normal ABIs in February 2025. Given the symptoms and abnormal stress test, she was started on aspirin , rosuvastatin ,  metoprolol , and PRN SL NTG, and cardiac catheterization was arranged for further evaluation.  Hospital Course     The patient underwent cardiac cath as noted above with successful PCI to OM2 with drug eluting stent placement. Per Dr. Elmira, given small caliber vessel and MSA of 4.5 mm only, he recommended 1 year of dual antiplatelet therapy with aspirin /Plavix , followed by Plavix  monotherapy. The patient was seen by cardiac rehab while in short stay. There were no observed complications post cath. Radial and femoral cath site was re-evaluated prior to discharge and found to be stable without any complications. (Radial was too small.) Instructions/precautions regarding cath site care were given prior to discharge.  Kelly Henson was seen by Dr. Elmira and determined stable for discharge home 4 hours after sheath pull. Follow up with our office has been arranged. Dr. Elmira is fine with keeping pre-existing appointments as scheduled with echo 9/4 and appointment 9/9. Medications are listed below. Pertinent changes include addition of Plavix . Dr. Elmira also changed the patient's omeprazole  to pantoprazole . 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 I discussed with Dr. Elmira and the patient. Dr. Elmira felt the earliest she could return would be 02/29/24 but he was fine with her awaiting completion of her echocardiogram and follow-up visit on 9/9. She elected the latter.  Of note, the  outpatient potassium was 5.2 yesterday therefore rechecked today and was normal at 4.6, likely lab error yesterday.  ___________  Cath/PCI Registry Performance & Quality Measures: Aspirin  prescribed? - Yes ADP Receptor Inhibitor (Plavix /Clopidogrel , Brilinta/Ticagrelor or Effient/Prasugrel) prescribed (includes medically managed patients)? - Yes High Intensity Statin (Lipitor 40-80mg  or Crestor  20-40mg ) prescribed? -  Yes For EF <40%, was ACEI/ARB prescribed? - Not Applicable (EF >/= 40%) For EF <40%, Aldosterone Antagonist (Spironolactone or Eplerenone) prescribed? - Not Applicable (EF >/= 40%) Cardiac Rehab Phase II ordered (Included Medically managed Patients)? - Yes  _____________   Discharge Vitals Blood pressure 101/61, pulse 71, temperature 99.4 F (37.4 C), temperature source Oral, resp. rate 14, height 5' 4 (1.626 m), weight 83 kg, last menstrual period 11/30/2014, SpO2 97%.  Filed Weights   02/18/24 0551  Weight: 83 kg    Last Labs & Radiologic Studies    CBC Recent Labs    02/17/24 0938  WBC 7.5  HGB 16.2*  HCT 47.8*  MCV 96.2  PLT 327   Basic Metabolic Panel Recent Labs    91/86/74 0938 02/18/24 1035  NA 138  --   K 5.2* 4.6  CL 103  --   CO2 27  --   GLUCOSE 99  --   BUN 14  --   CREATININE 0.80  --   CALCIUM  9.5  --    _____________  CARDIAC CATHETERIZATION Result Date: 02/18/2024 Images from the original result were not included. Coronary angiography & intervention 02/18/2024: LM: Normal LAD: No significant disease Lcx: Medium caliber vessel         Prox 30% disease         Small to medium caliber OM2 with prox 90% stenosis RCA: Small to medium caliber vessel           Proximal 60% stenosis, followed by 100% occlusion with ambiguous cap           Septal and epicardial left-to-right collaterals, grade 2 LVEDP 11 mmHg Successful percutaneous coronary intervention OM2        PTCA and stent placement 2.25 X 20 mm Synergy drug-eluting stent        Postdilatation using 2.25 x 12 mm [place] balloon up to 18 atm, and 2.5 x 12 mm [place] balloon up to 24 atm proximally Given small caliber vessel and MSA of 4.5 mm only, I recommend 1 year of dual antiplatelet therapy with aspirin  Plavix , followed by Plavix  monotherapy Recommend medical management for RCA COT for now Newman JINNY Lawrence, MD   Disposition   Pt is being discharged home today in good condition.  Follow-up Plans &  Appointments     Follow-up Information     [facility] Follow up.   Specialty: Cardiology Why: Keep follow-up as scheduled with echocardiogram on 03/10/24 and appointment on 03/15/24. Contact information: [address] [place]  [address] [phone]               Discharge Instructions     Amb Referral to Cardiac Rehabilitation   Complete by: As directed    Diagnosis: Coronary Stents   After initial evaluation and assessments completed: Virtual Based Care may be provided alone or in conjunction with Phase 2 Cardiac Rehab based on patient barriers.: Yes   Intensive Cardiac Rehabilitation (ICR) [facility] location only OR Traditional Cardiac Rehabilitation (TCR) *If criteria for ICR are not met will enroll in TCR ([facility] only): Yes      Discharge Instructions were  outlined on AVS (section does not populate here). The discharging nurse had since updated it. I manually edited the paperwork at bedside to reflect no driving x 2 days and no lifting over 5 lb for 1 week.  Discharge Medications   Allergies as of 02/18/2024   No Known Allergies      Medication List     STOP taking these medications    omeprazole  40 MG capsule Commonly known as: PRILOSEC       TAKE these medications    albuterol 108 (90 Base) MCG/ACT inhaler Commonly known as: VENTOLIN HFA Inhale 2 puffs into the lungs 2 (two) times daily.   aspirin  EC 81 MG tablet Take 1 tablet (81 mg total) by mouth daily. Swallow whole.   clopidogrel  75 MG tablet Commonly known as: PLAVIX  Take 1 tablet (75 mg total) by mouth daily with breakfast. Start taking on: February 19, 2024   latanoprost 0.005 % ophthalmic solution Commonly known as: XALATAN Place 1 drop into both eyes at bedtime.   levothyroxine  125 MCG tablet Commonly known as: SYNTHROID  Take 125 mcg by mouth daily before breakfast.   loratadine 10 MG tablet Commonly known as: CLARITIN Take 10 mg by mouth daily.    metoprolol  tartrate 25 MG tablet Commonly known as: LOPRESSOR  Take 1 tablet (25 mg total) by mouth 2 (two) times daily.   multivitamin tablet Take 1 tablet by mouth daily.   nitroGLYCERIN  0.4 MG SL tablet Commonly known as: Nitrostat  Place 1 tablet (0.4 mg total) under the tongue every 5 (five) minutes as needed for chest pain.   pantoprazole  20 MG tablet Commonly known as: Protonix  Take 1 tablet (20 mg total) by mouth daily.   rosuvastatin  20 MG tablet Commonly known as: CRESTOR  Take 1 tablet (20 mg total) by mouth at bedtime.           Allergies No Known Allergies  Outstanding Labs/Studies   N/A  Duration of Discharge Encounter   Greater than 30 minutes including physician time.  Signed, Chiana Wamser N Milarose Savich, PA-C 02/18/2024, 4:23 PM

## 2024-02-18 NOTE — Interval H&P Note (Signed)
 History and Physical Interval Note:  02/18/2024 7:39 AM  Kelly Henson  has presented today for surgery, with the diagnosis of angina.  The various methods of treatment have been discussed with the patient and family. After consideration of risks, benefits and other options for treatment, the patient has consented to  Procedure(s): LEFT HEART CATH AND CORONARY ANGIOGRAPHY (N/A) as a surgical intervention.  The patient's history has been reviewed, patient examined, no change in status, stable for surgery.  I have reviewed the patient's chart and labs.  Questions were answered to the patient's satisfaction.      Clare Fennimore J Heidi Lemay

## 2024-02-18 NOTE — Progress Notes (Signed)
 Site area: R femoral (arterial) Site Prior to Removal:  Level 0 Pressure Applied For: 30 min Manual:   yes Patient Status During Pull:  stable Post Pull Site:  Level 0 Post Pull Instructions Given:  yes Post Pull Pulses Present: +2 DP & +2 PT Dressing Applied:  gauze & tegaderm Bedrest begins @ 1220 Comments:   TR BAND REMOVAL  LOCATION:  R  radial   DEFLATED PER PROTOCOL:   yes  TIME BAND OFF / DRESSING APPLIED:  1230    SITE UPON ARRIVAL:    Level 0  SITE AFTER BAND REMOVAL:    Level 0  CIRCULATION SENSATION AND MOVEMENT:    Within Normal Limits  COMMENTS:

## 2024-02-18 NOTE — Progress Notes (Signed)
 Patient and husband was given discharge instructions. Both verbalized understanding.

## 2024-02-18 NOTE — Progress Notes (Signed)
 Pt was educated on stent card, stent location, Antiplatelet and ASA use, wt restrictions, no baths/daily wash-ups, s/s of infection, ex guidelines, s/s to stop exercising, NTG use and calling 911, heart healthy diet, risk factors and CRPII. Pt received materials on exercise, diet, and CRPII. Will refer to AP.   Garen FORBES Candy MS, ACSM-CEP  02/18/2024 2:44 PM

## 2024-02-18 NOTE — H&P (Signed)
 OV 02/16/2024 copied for documentation      Cardiology Office Note  Date: 02/18/2024   ID: Kelly Henson, DOB 1965/12/11, MRN [id number]  PCP:  Kelly Mardy DEL, PA-C  Cardiologist:  Diannah SHAUNNA Maywood, MD Electrophysiologist:  None   History of Present Illness: Kelly Henson is a 58 y.o. female  Referred to cardiology clinic for evaluation of abnormal cardiac stress test.  Records from Care Everywhere reviewed.  ABI in Feb 2025 was within normal limits.  NM stress test in August 2024 showed moderate reversible perfusion defect involving the apical inferior, mid inferior and basal inferior segments.  Less likely attenuation artifact.  Patient reported ongoing chest pain for the last 4 weeks.  Chest pain occurs substernally and radiates to bilateral shoulders, neck and back.  It occurs with any exertional activity.  Occurs daily.  It also started to occur at rest.  She noticed having more frequent episodes recently.  Never woke her up from the sleep.  Unable to tell me the duration of chest pains but it would ease down once she stops what she is doing.  It also resolves with aspirin .  Taking aspirin  81 mg every day.  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  Current smoker, smokes 1 pack/day.  She and her husband are motivated to quit.  I reviewed her ABI and stress test findings with the patient.  Past Medical History:  Diagnosis Date   Acid reflux    Allergy    Recurrent boils    Thyroid  disease    Wears dentures    full set on top and partial on bottom    Past Surgical History:  Procedure Laterality Date   cyst removed     DILATION AND CURETTAGE OF UTERUS     TUBAL LIGATION      Current Facility-Administered Medications  Medication Dose Route Frequency Provider Last Rate Last Admin   0.9 %  sodium chloride  infusion  250 mL Intravenous PRN Mallipeddi, Vishnu P, MD       aspirin  chewable tablet 81 mg  81 mg Oral Pre-Cath Mallipeddi,  Vishnu P, MD       free water  500 mL  500 mL Oral Once Mallipeddi, Vishnu P, MD       sodium chloride  flush (NS) 0.9 % injection 3 mL  3 mL Intravenous Q12H Mallipeddi, Vishnu P, MD       sodium chloride  flush (NS) 0.9 % injection 3 mL  3 mL Intravenous PRN Mallipeddi, Vishnu P, MD       Allergies:  Patient has no known allergies.   Social History: The patient  reports that she has been smoking cigarettes. She has a 58 pack-year smoking history. She has been exposed to tobacco smoke. She has never used smokeless tobacco. She reports that she does not drink alcohol and does not use drugs.   Family History: The patient's family history includes Cancer in her father; Diabetes in her mother and sister; Hypertension in her father and mother.   ROS:  Please see the history of present illness. Otherwise, complete review of systems is positive for none  All other systems are reviewed and negative.   Physical Exam: VS:  BP 115/78   Pulse 73   Temp 99.4 F (37.4 C) (Oral)   Resp 15   Ht 5' 4 (1.626 m)   Wt 83 kg   LMP 11/30/2014   SpO2 97%   BMI 31.41 kg/m ,  BMI Body mass index is 31.41 kg/m.  Wt Readings from Last 3 Encounters:  02/18/24 83 kg  02/16/24 84.9 kg  03/03/22 79.4 kg    General: Patient appears comfortable at rest. HEENT: Conjunctiva and lids normal, oropharynx clear with moist mucosa. Neck: Supple, no elevated JVP or carotid bruits, no thyromegaly. Lungs: Clear to auscultation, nonlabored breathing at rest. Cardiac: Regular rate and rhythm, no S3 or significant systolic murmur, no pericardial rub. Abdomen: Soft, nontender, no hepatomegaly, bowel sounds present, no guarding or rebound. Extremities: No pitting edema Skin: Warm and dry. Musculoskeletal: No kyphosis. Neuropsychiatric: Alert and oriented x3, affect grossly appropriate.  Recent Labwork: 02/17/2024: BUN 14; Creatinine, Ser 0.80; Hemoglobin 16.2; Platelets 327; Potassium 5.2; Sodium 138     Component Value  Date/Time   CHOL 176 08/22/2019 1146   TRIG 155 (H) 08/22/2019 1146   HDL 63 08/22/2019 1146   CHOLHDL 2.8 08/22/2019 1146   LDLCALC 87 08/22/2019 1146     Assessment and Plan:  Accelerating angina Positive cardiac stress test - Exertional chest pains x 4 weeks, radiating to her back, bilateral shoulders and neck that resolves with rest.  Occurs daily.  Occurs at rest sometimes.  Relieved by aspirin  and rest.  She reported that these chest pain episodes are becoming more frequent. - Stress test at [facility] showed moderate reversible perfusion defect in the entire inferior wall. - Start aspirin  81 mg once daily. - Start rosuvastatin  20 mg nightly. - Start metoprolol  tartrate 25 mg twice daily. - SL NTG 0.4 mg as needed for chest pain. - Obtain echo today cardiogram. - Patient will benefit from invasive ischemic evaluation with LHC due to accelerating angina and positive cardiac stress test.  HLD, not at goal - Start rosuvastatin  20 mg nightly.  Goal LDL less than 70.  Nicotine abuse - Current smoker.  Smokes 1 pack/day.  She and her husband decided to quit smoking completely.  Counseling provided.  Bilateral lower extremity claudication, rule out PAD - Continues to have claudication.  ABI within normal limits.  She probably would benefit from stress ABI versus ultrasound arterial Doppler studies.  Will discuss more about PAD in the next visit after her LHC. - Continue cardioprotective medications, aspirin , high intensity statin and smoking cessation counseling.   I spent 45 minutes reviewing the records, tests, labs, reports, discussion of CAD and abnormal cardiac stress test and documentation.  I answered all her questions.  Medication Adjustments/Labs and Tests Ordered: Current medicines are reviewed at length with the patient today.  Concerns regarding medicines are outlined above.    Disposition:  Follow up 1 month after LHC  Signed Vishnu Arleta Maywood, MD, 02/18/2024 7:38  AM    [facility] [address] [place], [place], [place] [address]

## 2024-02-18 NOTE — Discharge Instructions (Addendum)

## 2024-02-19 ENCOUNTER — Encounter (HOSPITAL_COMMUNITY): Payer: Self-pay | Admitting: Cardiology

## 2024-03-08 ENCOUNTER — Ambulatory Visit: Attending: Internal Medicine

## 2024-03-08 DIAGNOSIS — R079 Chest pain, unspecified: Secondary | ICD-10-CM | POA: Diagnosis not present

## 2024-03-08 LAB — ECHOCARDIOGRAM COMPLETE
AR max vel: 2.36 cm2
AV Peak grad: 6.1 mmHg
Ao pk vel: 1.23 m/s
Area-P 1/2: 3.5 cm2
Calc EF: 62.7 %
S' Lateral: 3.1 cm
Single Plane A2C EF: 66.9 %
Single Plane A4C EF: 60.5 %

## 2024-03-10 ENCOUNTER — Other Ambulatory Visit

## 2024-03-15 ENCOUNTER — Encounter: Payer: Self-pay | Admitting: Nurse Practitioner

## 2024-03-15 ENCOUNTER — Ambulatory Visit: Attending: Nurse Practitioner | Admitting: Nurse Practitioner

## 2024-03-15 VITALS — BP 116/70 | HR 68 | Ht 64.0 in | Wt 194.2 lb

## 2024-03-15 DIAGNOSIS — Z720 Tobacco use: Secondary | ICD-10-CM

## 2024-03-15 DIAGNOSIS — I2510 Atherosclerotic heart disease of native coronary artery without angina pectoris: Secondary | ICD-10-CM | POA: Diagnosis not present

## 2024-03-15 DIAGNOSIS — I200 Unstable angina: Secondary | ICD-10-CM

## 2024-03-15 DIAGNOSIS — M545 Low back pain, unspecified: Secondary | ICD-10-CM

## 2024-03-15 DIAGNOSIS — Z136 Encounter for screening for cardiovascular disorders: Secondary | ICD-10-CM

## 2024-03-15 NOTE — Progress Notes (Signed)
 Cardiology Office Note   Date:  03/15/2024 ID:  Kelly Henson, DOB 10/22/1965, MRN [id number] PCP: Vida Mardy DEL PA-C  [facility] Providers Cardiologist:  Diannah SHAUNNA Maywood, MD     History of Present Illness Kelly Henson is a 58 y.o. female with a PMH of chest pain, tobacco abuse, acid reflux, history of thyroid  disease, who presents today for postcardiac cath follow-up.  Last seen by Dr. Mallipeddi on 02/16/2024.  She was referred to cardiology clinic for evaluation of abnormal cardiac stress test.  See full report below.  She had noted ongoing exertional chest pain for at least 4 weeks that was located substernally, radiated to bilateral shoulders neck and back, episodes were happening more frequently.   She underwent left heart catheterization on February 18, 2024.  She received successful PCI of OM2, PTCA and stent placed.  Echocardiogram was also arranged and revealed LVEF 60 to 65%, grade 1 DD, no significant valvular abnormalities.  Today she presents for post cardiac catheterization follow-up.  She does admit to body aches, wonders if this is related to her medication. Does admit to back pains, no hx of AAA. Denies any red flag symptoms. Denies any chest pain, shortness of breath, palpitations, syncope, presyncope, dizziness, orthopnea, PND, swelling or significant weight changes, acute bleeding, or claudication.  ROS: Negative. See HPI.   Studies Reviewed  EKG: EKG is not ordered today.   Echo 03/2024:  1. Left ventricular ejection fraction, by estimation, is 60 to 65%. Left  ventricular ejection fraction by 3D volume is 61 %. The left ventricle has  normal function. The left ventricle has no regional wall motion  abnormalities. Left ventricular diastolic   parameters are consistent with Grade I diastolic dysfunction (impaired  relaxation).   2. Right ventricular systolic function is normal. The right ventricular  size is normal. Tricuspid regurgitation  signal is inadequate for assessing  PA pressure.   3. The mitral valve is normal in structure. No evidence of mitral valve  regurgitation. No evidence of mitral stenosis.   4. The aortic valve has an indeterminant number of cusps. Aortic valve  regurgitation is not visualized. No aortic stenosis is present.   5. The inferior vena cava is normal in size with greater than 50%  respiratory variability, suggesting right atrial pressure of 3 mmHg.   Comparison(s): No prior Echocardiogram.  LHC 02/2024:  Coronary angiography & intervention 02/18/2024: LM: Normal LAD: No significant disease Lcx: Medium caliber vessel         Prox 30% disease         Small to medium caliber OM2 with prox 90% stenosis RCA: Small to medium caliber vessel           Proximal 60% stenosis, followed by 100% occlusion with ambiguous cap           Septal and epicardial left-to-right collaterals, grade 2   LVEDP 11 mmHg   Successful percutaneous coronary intervention OM2        PTCA and stent placement 2.25 X 20 mm Synergy drug-eluting stent        Postdilatation using 2.25 x 12 mm Nellie balloon up to 18 atm, and 2.5 x 12 mm Duncan balloon up to 24 atm proximally      Given small caliber vessel and MSA of 4.5 mm only, I recommend 1 year of dual antiplatelet therapy with aspirin  Plavix , followed by Plavix  monotherapy Recommend medical management for RCA COT for now   [employer],  MD   Risk Assessment/Calculations      The 10-year ASCVD risk score (Arnett DK, et al., 2019) is: 5.2%   Values used to calculate the score:     Age: 72 years     Clincally relevant sex: Female     Is Non-Hispanic African American: No     Diabetic: No     Tobacco smoker: Yes     Systolic Blood Pressure: 116 mmHg     Is BP treated: No     HDL Cholesterol: 49 mg/dL     Total Cholesterol: 205 mg/dL  Physical Exam VS:  BP 116/70   Pulse 68   Ht 5' 4 (1.626 m)   Wt 194 lb 3.2 oz (88.1 kg)   LMP 11/30/2014   SpO2 98%   BMI  33.33 kg/m        Wt Readings from Last 3 Encounters:  03/15/24 194 lb 3.2 oz (88.1 kg)  02/18/24 183 lb (83 kg)  02/16/24 187 lb 3.2 oz (84.9 kg)    GEN: Well nourished, well developed in no acute distress NECK: No JVD; No carotid bruits CARDIAC: S1/S2, RRR, no murmurs, rubs, gallops RESPIRATORY:  Clear to auscultation without rales, wheezing or rhonchi  ABDOMEN: Soft, non-tender, non-distended EXTREMITIES:  No edema; No deformity   ASSESSMENT AND PLAN  CAD, s/p PTCA and stent to OM2 Stable with no anginal symptoms. No indication for ischemic evaluation. Recent Echo benign.  She does notice some body aches and instructed her to hold Crestor  for 1 week and update us  via MyChart or call her office with an update.  Continue aspirin , Plavix , Lopressor , nitroglycerin  as needed. Heart healthy diet and regular cardiovascular exercise encouraged. Okay to start cardiac rehab. Will obtain CBC and BMET.     2. Tobacco abuse Smoking cessation encouraged and discussed.   3. Low back pain Denies any red flag signs or symptoms. Recommended conservative measures and to follow-up with PCP for imaging if conservative measures do not improve pain within the next 4-6 weeks according to guidelines.    Dispo: Care and ED precautions discussed. Follow-up with MD/APP in 2-3 months or sooner if anything changes.   Signed, Almarie Crate, NP

## 2024-03-15 NOTE — Patient Instructions (Addendum)
 Medication Instructions:  Your physician has recommended you make the following change in your medication:  Please HOLD Crestor  for 1 week and update us  on your symptoms via MyChart   Labwork: In 2-3 weeks with [facility]   Testing/Procedures: None   Follow-Up: Your physician recommends that you schedule a follow-up appointment in: 2-3 months   Any Other Special Instructions Will Be Listed Below (If Applicable).  If you need a refill on your cardiac medications before your next appointment, please call your pharmacy.

## 2024-03-18 ENCOUNTER — Ambulatory Visit: Payer: Self-pay | Admitting: Internal Medicine

## 2024-03-21 NOTE — Telephone Encounter (Signed)
-----   Message from Vishnu P Mallipeddi sent at 03/18/2024  3:44 PM EDT ----- Normal LV function, normal RV function, no valvular heart disease and CVP 3 mmHg.  Overall, normal echocardiogram. ----- Message ----- From: Interface, [employer] One [employer] Sent: 03/08/2024  12:34 PM EDT To: Vishnu P Mallipeddi, MD

## 2024-03-21 NOTE — Telephone Encounter (Signed)
 The patient has been notified of the result and verbalized understanding.  All questions (if any) were answered. Littie CHRISTELLA Croak, CMA 03/21/2024 2:13 PM

## 2024-04-01 ENCOUNTER — Encounter: Payer: Self-pay | Admitting: Neurology

## 2024-04-01 ENCOUNTER — Encounter: Admitting: Neurology

## 2024-04-20 ENCOUNTER — Encounter (INDEPENDENT_AMBULATORY_CARE_PROVIDER_SITE_OTHER): Payer: Self-pay | Admitting: Gastroenterology

## 2024-05-06 ENCOUNTER — Encounter: Admitting: Neurology

## 2024-05-23 ENCOUNTER — Other Ambulatory Visit (HOSPITAL_COMMUNITY): Payer: Self-pay

## 2024-06-17 ENCOUNTER — Encounter: Admitting: Neurology

## 2024-06-24 ENCOUNTER — Ambulatory Visit: Admitting: Nurse Practitioner

## 2024-09-09 ENCOUNTER — Ambulatory Visit: Admitting: Nurse Practitioner

## 2024-10-14 ENCOUNTER — Encounter: Payer: Self-pay | Admitting: Neurology
